# Patient Record
Sex: Male | Born: 1960 | Race: White | Hispanic: No | Marital: Married | Smoking: Never smoker
Health system: Southern US, Community
[De-identification: ages and names within clinical notes are randomized; demographics above are authoritative.]

## PROBLEM LIST (undated history)

## (undated) DIAGNOSIS — R861 Abnormal level of hormones in specimens from male genital organs: Secondary | ICD-10-CM

## (undated) DIAGNOSIS — I509 Heart failure, unspecified: Secondary | ICD-10-CM

## (undated) HISTORY — DX: Abnormal level of hormones in specimens from male genital organs: R86.1

## (undated) HISTORY — DX: Heart failure, unspecified: I50.9

---

## 2012-01-03 ENCOUNTER — Ambulatory Visit: Payer: Self-pay | Admitting: Internal Medicine

## 2013-12-16 IMAGING — CR RIGHT HAND - COMPLETE 3+ VIEW
1 series · 3 of 3 positions shown · non-contrast
Comparison: none

REASON FOR EXAM: arthraigia
COMMENTS:

PROCEDURE:     DXR - DXR HAND RT COMPLETE W/OBLIQUES  - January 03, 2012  [DATE]
RESULT:     Comparison:  None

[Series 1: x hand pa right · 0.14mm/px · 3 of 3 slices shown]
[im 1/3]
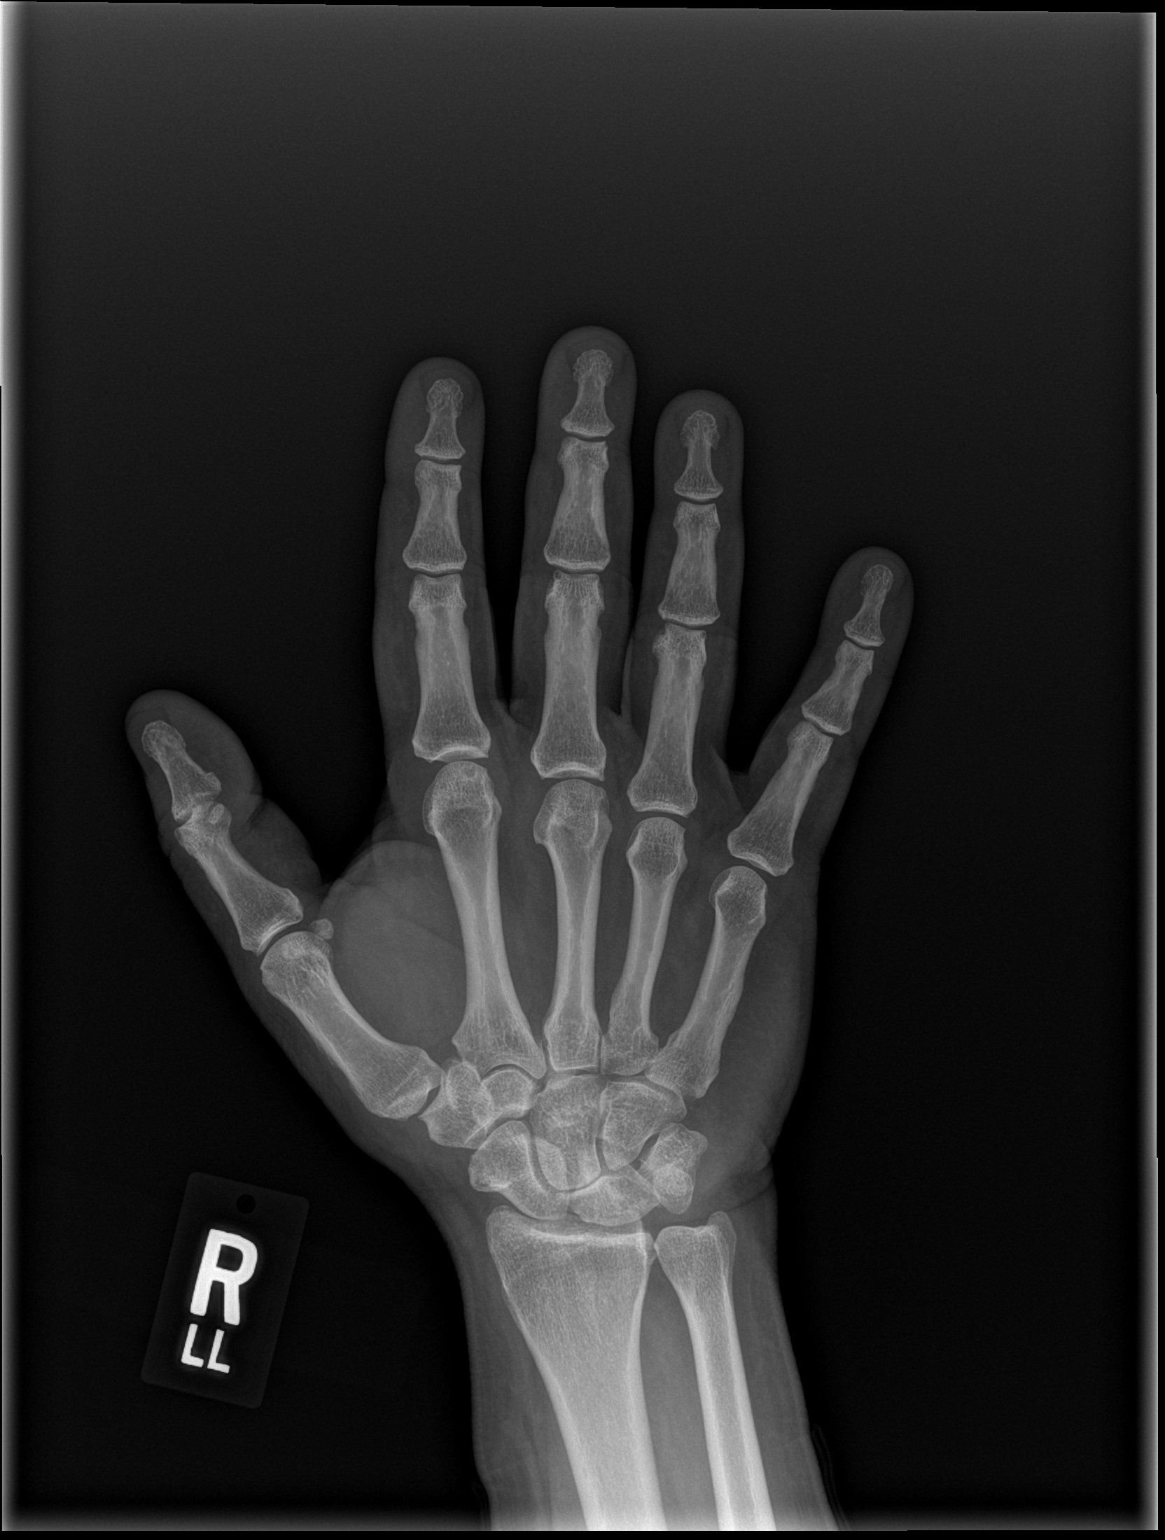
[im 2/3]
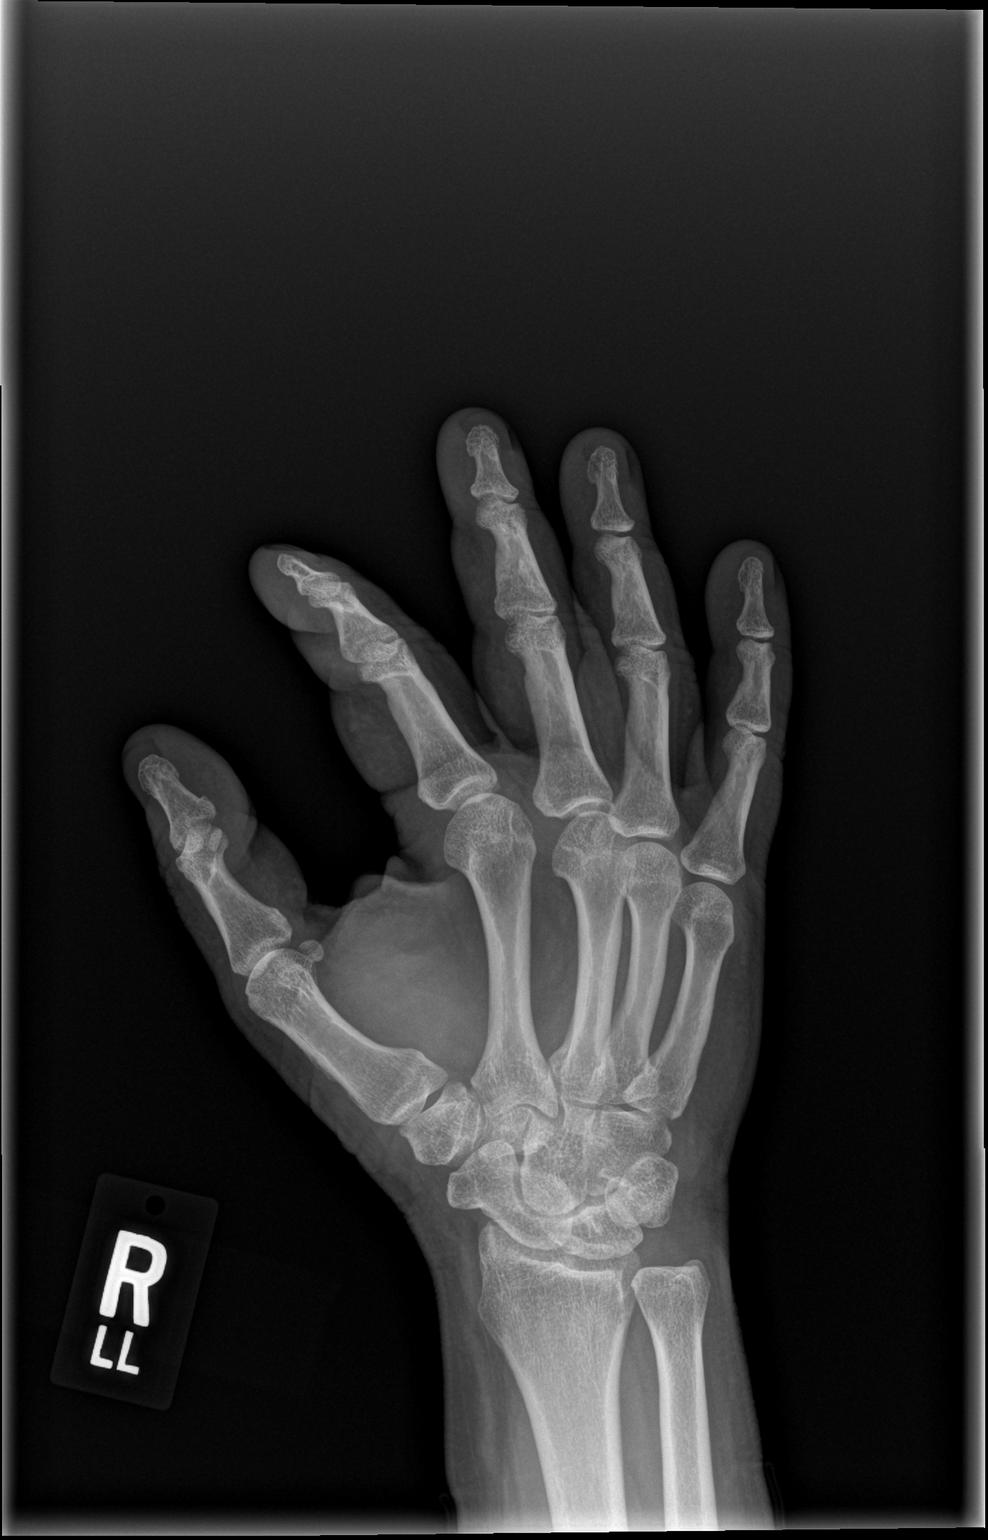
[im 3/3]
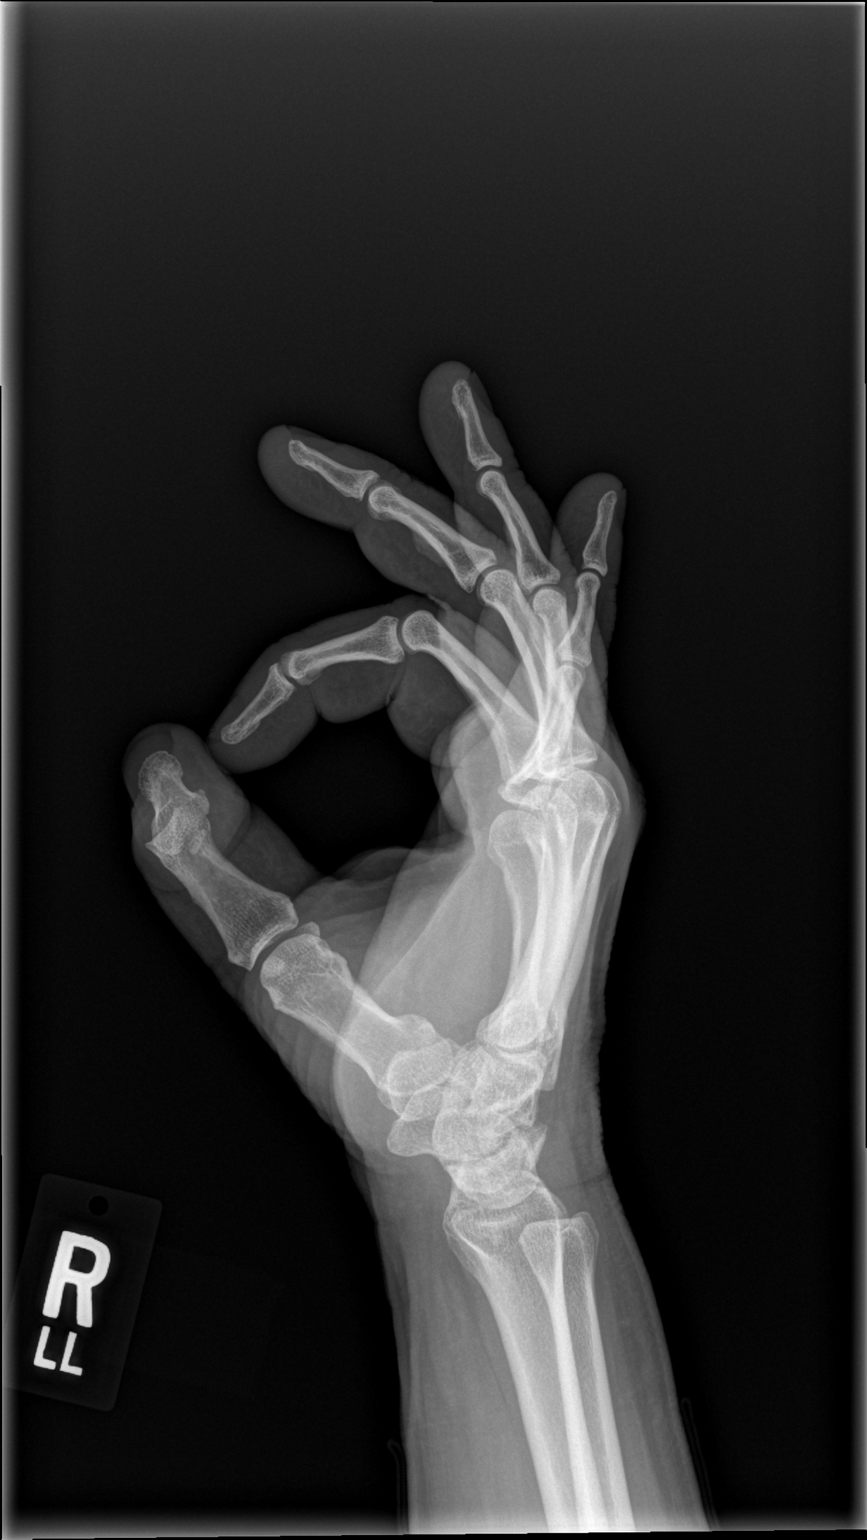

[3 of 3 positions shown; findings below may reference images not displayed]

FINDINGS: AP, oblique, and lateral views of the right hand demonstrates no fracture or
dislocation. There is normal bone mineralization. There are no erosive
changes. The joint spaces are maintained. There is no soft tissue swelling.
IMPRESSION: No acute osseous abnormality of the right hand.

[REDACTED]

## 2019-10-18 ENCOUNTER — Telehealth: Payer: Self-pay | Admitting: *Deleted

## 2019-10-18 NOTE — Telephone Encounter (Signed)
Error

## 2023-04-18 ENCOUNTER — Encounter: Payer: Self-pay | Admitting: Cardiology

## 2023-04-18 ENCOUNTER — Ambulatory Visit: Payer: Self-pay | Attending: Cardiology | Admitting: Cardiology

## 2023-04-18 VITALS — BP 110/78 | HR 106 | Resp 16 | Ht 67.0 in | Wt 213.0 lb

## 2023-04-18 DIAGNOSIS — R0601 Orthopnea: Secondary | ICD-10-CM | POA: Insufficient documentation

## 2023-04-18 DIAGNOSIS — R0609 Other forms of dyspnea: Secondary | ICD-10-CM | POA: Insufficient documentation

## 2023-04-18 DIAGNOSIS — R0600 Dyspnea, unspecified: Secondary | ICD-10-CM | POA: Insufficient documentation

## 2023-04-18 MED ORDER — FUROSEMIDE 20 MG PO TABS: 20.0000 mg | ORAL_TABLET | Freq: Every day | ORAL | 3 refills | Status: DC

## 2023-04-18 NOTE — Patient Instructions (Signed)
 Medication Instructions:  RESTART Furosemide 20 mg take one tablet by mouth daily   *If you need a refill on your cardiac medications before your next appointment, please call your pharmacy*  Lab Work: CBC BMP BNP TSH  If you have labs (blood work) drawn today and your tests are completely normal, you will receive your results only by: MyChart Message (if you have MyChart) OR A paper copy in the mail If you have any lab test that is abnormal or we need to change your treatment, we will call you to review the results.  Testing/Procedures: ECHO  Your physician has requested that you have an echocardiogram. Echocardiography is a painless test that uses sound waves to create images of your heart. It provides your doctor with information about the size and shape of your heart and how well your heart's chambers and valves are working. This procedure takes approximately one hour. There are no restrictions for this procedure. Please do NOT wear cologne, perfume, aftershave, or lotions (deodorant is allowed). Please arrive 15 minutes prior to your appointment time.  Please note: We ask at that you not bring children with you during ultrasound (echo/ vascular) testing. Due to room size and safety concerns, children are not allowed in the ultrasound rooms during exams. Our front office staff cannot provide observation of children in our lobby area while testing is being conducted. An adult accompanying a patient to their appointment will only be allowed in the ultrasound room at the discretion of the ultrasound technician under special circumstances. We apologize for any inconvenience.   Follow-Up: At [facility], you and your health needs are our priority.  As part of our continuing mission to provide you with exceptional heart care, our providers are all part of one team.  This team includes your primary Cardiologist (physician) and Advanced Practice Providers or APPs (Physician Assistants  and Nurse Practitioners) who all work together to provide you with the care you need, when you need it.  Your next appointment:   2-3 week(s)  Provider:   Elder Negus, MD     We recommend signing up for the patient portal called "MyChart".  Sign up information is provided on this After Visit Summary.  MyChart is used to connect with patients for Virtual Visits (Telemedicine).  Patients are able to view lab/test results, encounter notes, upcoming appointments, etc.  Non-urgent messages can be sent to your provider as well.   To learn more about what you can do with MyChart, go to [email or web address].   Other Instructions      1st Floor: - Lobby - Registration  - Pharmacy  - Lab - Cafe  2nd Floor: - PV Lab - Diagnostic Testing (echo, CT, nuclear med)  3rd Floor: - Vacant  4th Floor: - TCTS (cardiothoracic surgery) - AFib Clinic - Structural Heart Clinic - Vascular Surgery  - Vascular Ultrasound  5th Floor: - HeartCare Cardiology (general and EP) - Clinical Pharmacy for coumadin, hypertension, lipid, weight-loss medications, and med management appointments    Valet parking services will be available as well.

## 2023-04-18 NOTE — Progress Notes (Signed)
 Cardiology Office Note:  .   Date:  04/18/2023  ID:  Calvin Drake, DOB May 26, 1960, MRN [id number] PCP: No primary care provider on file.  [facility] Providers Cardiologist:  Truett Mainland, MD PCP: No primary care provider on file.  No chief complaint on file.    Calvin Drake is a 63 y.o. male with exertional dyspnea, orthopnea, PND   Discussed the use of AI scribe software for clinical note transcription with the patient, who gave verbal consent to proceed.  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 The patient reports that he has been feeling unwell since December, when he had a tooth filling fall out. He developed a blister on his gum, which he believes was an infection. After Christmas, he experienced a flu-like illness and was bedridden for three days. Following this, he noticed increasing shortness of breath, to the point where he could not walk a short distance without becoming breathless.  The patient was diagnosed with heart failure at an emergency room visit, with "fluid on his heart".  BNP was elevated at 2700.  He was treated with Lasix, which initially improved his breathing. However, he has since experienced fluctuating symptoms, with some days feeling well enough to work and others feeling too unwell to leave the house. The patient also reports a persistent cough and difficulty lying flat due to breathlessness.  In addition to his heart issues, the patient has had ongoing dental problems. He recently had another filling fall out and was found to have a pocket of infection in his gum. He was prescribed amoxicillin, which initially made him feel better but also caused gastrointestinal side effects. The patient believes that the combination of amoxicillin and Lasix led to dehydration.      Vitals:   04/18/23 1441  BP: 110/78  Pulse: (!) 106   Resp: 16  SpO2: 96%      Review of Systems  Cardiovascular:  Positive for dyspnea on exertion, orthopnea and paroxysmal nocturnal dyspnea. Negative for chest pain, leg swelling, palpitations and syncope.        Studies Reviewed: Marland Kitchen        EKG 04/18/2023: Sinus tachycardia 107 bpm Possible Anterior infarct , age undetermined No previous ECGs available     Independently interpreted BNP 2700   Physical Exam Vitals and nursing note reviewed.  Constitutional:      General: He is not in acute distress. Neck:     Vascular: JVD present.  Cardiovascular:     Rate and Rhythm: Normal rate and regular rhythm.     Heart sounds: Murmur heard.     High-pitched blowing holosystolic murmur is present with a grade of 3/6 at the apex.  Pulmonary:     Effort: Pulmonary effort is normal.     Breath sounds: Normal breath sounds. No wheezing or rales.  Musculoskeletal:     Right lower leg: Edema (Trace) present.     Left lower leg: Edema (Trace\) present.     VISIT DIAGNOSES:   ICD-10-CM   1. Exertional dyspnea  R06.09 EKG 12-Lead    ECHOCARDIOGRAM COMPLETE    CBC    Basic metabolic panel with GFR    Brain natriuretic peptide    TSH    2. Orthopnea  R06.01     3. PND (paroxysmal nocturnal dyspnea)  R06.00        Calvin Drake is a 63 y.o. male  with exertional dyspnea Assessment and Plan Assessment & Plan Exertional dyspnea: Along with orthopnea and PND symptoms, as well as elevated BNP noted in recent ER visit, I do think patient has systolic heart failure.  I reckon his dental problems are coincidental and not directly related to his cardiac issues.  Viral illness may or may not be the inciting cause-possibility of viral cardiomyopathy.  He has prominent holosystolic murmur.  Question is whether systolic heart failure, possibly viral cardiomyopathy, caused severe mitral regurgitation, or if mitral regurgitation is primary, thereby causing his symptoms. Continue Lasix 20  mg daily, increase to 40 mg daily as needed for shortness of breath and inadequate urine output. Will obtain CBC, BMP, BNP, TSH labs today. Will obtain echocardiogram in next week or 2. His further management will depend on echocardiogram findings.        Meds ordered this encounter  Medications  . furosemide (LASIX) 20 MG tablet    Sig: Take 1 tablet (20 mg total) by mouth daily. Additional dose for inadequate urine output    Dispense:  125 tablet    Refill:  3     F/u in 3-4 weeks  Signed, Elder Negus, MD

## 2023-04-19 ENCOUNTER — Other Ambulatory Visit: Payer: Self-pay | Admitting: Cardiology

## 2023-04-19 LAB — CBC
Hematocrit: 44.9 % (ref 37.5–51.0)
Hemoglobin: 15.5 g/dL (ref 13.0–17.7)
MCH: 32.4 pg (ref 26.6–33.0)
MCHC: 34.5 g/dL (ref 31.5–35.7)
MCV: 94 fL (ref 79–97)
Platelets: 178 10*3/uL (ref 150–450)
RBC: 4.79 x10E6/uL (ref 4.14–5.80)
RDW: 11.9 % (ref 11.6–15.4)
WBC: 9.2 10*3/uL (ref 3.4–10.8)

## 2023-04-19 LAB — BASIC METABOLIC PANEL WITH GFR
BUN/Creatinine Ratio: 17 (ref 10–24)
BUN: 19 mg/dL (ref 8–27)
CO2: 23 mmol/L (ref 20–29)
Calcium: 9.1 mg/dL (ref 8.6–10.2)
Chloride: 104 mmol/L (ref 96–106)
Creatinine, Ser: 1.15 mg/dL (ref 0.76–1.27)
Glucose: 98 mg/dL (ref 70–99)
Potassium: 4.9 mmol/L (ref 3.5–5.2)
Sodium: 142 mmol/L (ref 134–144)
eGFR: 72 mL/min/{1.73_m2} (ref >59)

## 2023-04-19 LAB — TSH: TSH: 3.39 u[IU]/mL (ref 0.450–4.500)

## 2023-04-19 LAB — BRAIN NATRIURETIC PEPTIDE: BNP: 566.1 pg/mL — ABNORMAL HIGH (ref 0.0–100.0)

## 2023-04-19 NOTE — Progress Notes (Signed)
 BNP is improved compared to before, but still elevated 566.  Continue Lasix 20 mg daily.  On days when he has more shortness of breath or leg swelling than usual, he can take 40 mg daily.  Will await echocardiogram before further recommendations.  Thanks MJP

## 2023-04-22 ENCOUNTER — Ambulatory Visit (HOSPITAL_COMMUNITY): Payer: Self-pay | Attending: Cardiology

## 2023-04-22 DIAGNOSIS — R0609 Other forms of dyspnea: Secondary | ICD-10-CM | POA: Insufficient documentation

## 2023-04-22 LAB — ECHOCARDIOGRAM COMPLETE
AR max vel: 0.84 cm2
AV Area VTI: 0.55 cm2
AV Area mean vel: 0.74 cm2
AV Mean grad: 19 mmHg
AV Peak grad: 34 mmHg
Ao pk vel: 2.92 m/s
Area-P 1/2: 4.05 cm2
MV M vel: 3.82 m/s
MV Peak grad: 58.4 mmHg
MV VTI: 1.01 cm2
S' Lateral: 3.2 cm

## 2023-04-25 ENCOUNTER — Telehealth: Payer: Self-pay | Admitting: Cardiology

## 2023-04-25 NOTE — Telephone Encounter (Signed)
 Pt is returning call to nurse for results

## 2023-04-25 NOTE — Telephone Encounter (Signed)
Please review result note.

## 2023-04-25 NOTE — Progress Notes (Signed)
 There is severe leakiness of mitral valve, likely the primary cause of your symptoms.  Heart function is relatively well-preserved.  Given your symptoms, I do think we will need to discuss further steps, including more imaging and heart catheterization, and referral to valve surgery.  Your current appointment with me is on 05/17/2023.  I am happy to see you sooner to discuss this further.  We can do this as a televisit to save your trip, if you would prefer.  Thanks MJP

## 2023-04-28 ENCOUNTER — Encounter: Payer: Self-pay | Admitting: Cardiology

## 2023-04-28 ENCOUNTER — Ambulatory Visit: Payer: Self-pay | Attending: Cardiology | Admitting: Cardiology

## 2023-04-28 VITALS — BP 110/82 | HR 105 | Resp 16 | Ht 67.0 in | Wt 217.6 lb

## 2023-04-28 DIAGNOSIS — Z01810 Encounter for preprocedural cardiovascular examination: Secondary | ICD-10-CM | POA: Insufficient documentation

## 2023-04-28 DIAGNOSIS — I340 Nonrheumatic mitral (valve) insufficiency: Secondary | ICD-10-CM | POA: Insufficient documentation

## 2023-04-28 MED ORDER — FUROSEMIDE 20 MG PO TABS: 20.0000 mg | ORAL_TABLET | Freq: Two times a day (BID) | ORAL | 3 refills | Status: DC

## 2023-04-28 NOTE — Progress Notes (Signed)
 Cardiology Office Note:  .   Date:  04/28/2023  ID:  Calvin Drake, DOB 08-29-1960, MRN [id number] PCP: No primary care provider on file.  [facility] Providers Cardiologist:  Truett Mainland, MD PCP: No primary care provider on file.  Chief Complaint  Patient presents with   Results   Follow-up     Calvin Drake is a 63 y.o. male with severe symptomatic mitral regurgitation   Patient continues to have exertional dyspnea and fatigue symptoms.  His current taking Lasix 20 mg daily.  Reviewed recent echocardiogram results with the patient at length, details    Vitals:   04/28/23 1004  BP: 110/82  Pulse: (!) 105  Resp: 16  SpO2: 91%       Review of Systems  Cardiovascular:  Positive for dyspnea on exertion, orthopnea and paroxysmal nocturnal dyspnea. Negative for chest pain, leg swelling, palpitations and syncope.        Studies Reviewed: Marland Kitchen        EKG 04/18/2023: Sinus tachycardia 107 bpm Possible Anterior infarct , age undetermined No previous ECGs available     Independently interpreted BNP 2700  Independently interpreted Echocardiogram 04/22/2023: LVEF 60 to 65%.  Diastolic dysfunction likely erroneous given severe MR. Flail portion of posterior leaflet. Severe mitral valve regurgitation. Normal RV systolic function. Severe biatrial enlargement. Moderate pulmonary hypertension, RVSP 55 mmHg.    Physical Exam Vitals and nursing note reviewed.  Constitutional:      General: He is not in acute distress. Neck:     Vascular: JVD present.  Cardiovascular:     Rate and Rhythm: Normal rate and regular rhythm.     Heart sounds: Murmur heard.     High-pitched blowing holosystolic murmur is present with a grade of 3/6 at the apex.  Pulmonary:     Effort: Pulmonary effort is normal.     Breath sounds: Normal breath sounds. No wheezing or rales.  Musculoskeletal:     Right lower leg: No edema.     Left lower leg: No edema.      VISIT  DIAGNOSES:   ICD-10-CM   1. Severe mitral regurgitation  I34.0 Ambulatory referral to Cardiothoracic Surgery    CANCELED: Basic Metabolic Panel (BMET)    CANCELED: CBC    2. Pre-procedural cardiovascular examination  Z01.810 CANCELED: Basic Metabolic Panel (BMET)    CANCELED: CBC        Calvin Drake is a 63 y.o. male with severe symptomatic mitral regurgitation  Assessment & Plan Mitral regurgitation: Thickening of mitral valve leaflets with flail posterior leaflet.  I suspect this likely became flail around Christmas when his symptoms started.  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 Given his symptoms, severe MR, and presence of pulmonary hypertension, mitral valve repair is absolutely indicated.   I will refer him to CVTS for consideration for surgical mitral valve repair. I will arrange TEE and right and left heart catheterization, scheduled on 05/16/2022. In the meantime, increase Lasix to 20 mg twice daily.  I have reviewed the risks, indications, and alternatives to cardiac catheterization, possible angioplasty, and stenting with the patient. Risks include but are not limited to bleeding, infection, vascular injury, stroke, myocardial infection, arrhythmia, kidney injury, radiation-related injury in the case of prolonged fluoroscopy use, emergency cardiac surgery, and death. The patient understands the risks of serious complication is 1-2 in 1000 with diagnostic cardiac cath and 1-2% or less with angioplasty/stenting.     [facility] has been requested to perform  a transesophageal echocardiogram on Calvin Drake for transesophageal echocardiography.     The patient does NOT have any absolute or relative contraindications to a Transesophageal Echocardiogram (TEE).  The patient has: History of Severe Valve Disease (stenosis or regurgitation)  After careful review of history and examination, the risks and benefits of transesophageal echocardiogram have been  explained including risks of esophageal damage, perforation (1:10,000 risk), bleeding, pharyngeal hematoma as well as other potential complications associated with conscious sedation including aspiration, arrhythmia, respiratory failure and death. Alternatives to treatment were discussed, questions were answered. Patient is willing to proceed.   Signed, Elder Negus, MD  04/28/2023 1:15 PM          Meds ordered this encounter  Medications   furosemide (LASIX) 20 MG tablet    Sig: Take 1 tablet (20 mg total) by mouth in the morning and at bedtime.    Dispense:  180 tablet    Refill:  3     F/u in 3 months  Signed, Elder Negus, MD

## 2023-04-28 NOTE — H&P (View-Only) (Signed)
 Cardiology Office Note:  .   Date:  04/28/2023  ID:  Calvin Drake, DOB 08-29-1960, MRN [id number] PCP: No primary care provider on file.  [facility] Providers Cardiologist:  Truett Mainland, MD PCP: No primary care provider on file.  Chief Complaint  Patient presents with   Results   Follow-up     Calvin Drake is a 63 y.o. male with severe symptomatic mitral regurgitation   Patient continues to have exertional dyspnea and fatigue symptoms.  His current taking Lasix 20 mg daily.  Reviewed recent echocardiogram results with the patient at length, details    Vitals:   04/28/23 1004  BP: 110/82  Pulse: (!) 105  Resp: 16  SpO2: 91%       Review of Systems  Cardiovascular:  Positive for dyspnea on exertion, orthopnea and paroxysmal nocturnal dyspnea. Negative for chest pain, leg swelling, palpitations and syncope.        Studies Reviewed: Marland Kitchen        EKG 04/18/2023: Sinus tachycardia 107 bpm Possible Anterior infarct , age undetermined No previous ECGs available     Independently interpreted BNP 2700  Independently interpreted Echocardiogram 04/22/2023: LVEF 60 to 65%.  Diastolic dysfunction likely erroneous given severe MR. Flail portion of posterior leaflet. Severe mitral valve regurgitation. Normal RV systolic function. Severe biatrial enlargement. Moderate pulmonary hypertension, RVSP 55 mmHg.    Physical Exam Vitals and nursing note reviewed.  Constitutional:      General: He is not in acute distress. Neck:     Vascular: JVD present.  Cardiovascular:     Rate and Rhythm: Normal rate and regular rhythm.     Heart sounds: Murmur heard.     High-pitched blowing holosystolic murmur is present with a grade of 3/6 at the apex.  Pulmonary:     Effort: Pulmonary effort is normal.     Breath sounds: Normal breath sounds. No wheezing or rales.  Musculoskeletal:     Right lower leg: No edema.     Left lower leg: No edema.      VISIT  DIAGNOSES:   ICD-10-CM   1. Severe mitral regurgitation  I34.0 Ambulatory referral to Cardiothoracic Surgery    CANCELED: Basic Metabolic Panel (BMET)    CANCELED: CBC    2. Pre-procedural cardiovascular examination  Z01.810 CANCELED: Basic Metabolic Panel (BMET)    CANCELED: CBC        Calvin Drake is a 63 y.o. male with severe symptomatic mitral regurgitation  Assessment & Plan Mitral regurgitation: Thickening of mitral valve leaflets with flail posterior leaflet.  I suspect this likely became flail around Christmas when his symptoms started.  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 Given his symptoms, severe MR, and presence of pulmonary hypertension, mitral valve repair is absolutely indicated.   I will refer him to CVTS for consideration for surgical mitral valve repair. I will arrange TEE and right and left heart catheterization, scheduled on 05/16/2022. In the meantime, increase Lasix to 20 mg twice daily.  I have reviewed the risks, indications, and alternatives to cardiac catheterization, possible angioplasty, and stenting with the patient. Risks include but are not limited to bleeding, infection, vascular injury, stroke, myocardial infection, arrhythmia, kidney injury, radiation-related injury in the case of prolonged fluoroscopy use, emergency cardiac surgery, and death. The patient understands the risks of serious complication is 1-2 in 1000 with diagnostic cardiac cath and 1-2% or less with angioplasty/stenting.     [facility] has been requested to perform  a transesophageal echocardiogram on Calvin Drake for transesophageal echocardiography.     The patient does NOT have any absolute or relative contraindications to a Transesophageal Echocardiogram (TEE).  The patient has: History of Severe Valve Disease (stenosis or regurgitation)  After careful review of history and examination, the risks and benefits of transesophageal echocardiogram have been  explained including risks of esophageal damage, perforation (1:10,000 risk), bleeding, pharyngeal hematoma as well as other potential complications associated with conscious sedation including aspiration, arrhythmia, respiratory failure and death. Alternatives to treatment were discussed, questions were answered. Patient is willing to proceed.   Signed, Elder Negus, MD  04/28/2023 1:15 PM          Meds ordered this encounter  Medications   furosemide (LASIX) 20 MG tablet    Sig: Take 1 tablet (20 mg total) by mouth in the morning and at bedtime.    Dispense:  180 tablet    Refill:  3     F/u in 3 months  Signed, Elder Negus, MD

## 2023-04-28 NOTE — Patient Instructions (Addendum)
 Medication Instructions:  INCREASE Lasix to twice daily   *If you need a refill on your cardiac medications before your next appointment, please call your pharmacy*  Lab Work:  If you have labs (blood work) drawn today and your tests are completely normal, you will receive your results only by: MyChart Message (if you have MyChart) OR A paper copy in the mail If you have any lab test that is abnormal or we need to change your treatment, we will call you to review the results.  Testing/Procedures:        Cardiac/Peripheral Catheterization   You are scheduled for a Cardiac Catheterization on Monday, April 28 with Dr.  Rosemary Holms .  1. Please arrive at the [facility] (Main Entrance A) at [facility]: [address] [place], [place] [address] at 5:30 AM (This time is 2 hour(s) before your procedure to ensure your preparation).   Free valet parking service is available. You will check in at ADMITTING. The support person will be asked to wait in the waiting room.  It is OK to have someone drop you off and come back when you are ready to be discharged.        Special note: Every effort is made to have your procedure done on time. Please understand that emergencies sometimes delay scheduled procedures.  2. Diet: Do not eat solid foods after midnight.  You may have clear liquids until 5 AM the day of the procedure.  3. Labs: Done   4. Medication instructions in preparation for your procedure:   Contrast Allergy: No  Stop taking, Lasix (Furosemide)  Monday, April 28,  On the morning of your procedure, take Aspirin 81 mg and any morning medicines NOT listed above.  You may use sips of water.  5. Plan to go home the same day, you will only stay overnight if medically necessary. 6. You MUST have a responsible adult to drive you home. 7. An adult MUST be with you the first 24 hours after you arrive home. 8. Bring a current list of your medications, and the last time and date  medication taken. 9. Bring ID and current insurance cards. 10.Please wear clothes that are easy to get on and off and wear slip-on shoes.  Thank you for allowing [place] to care for you!   -- [facility]   Follow-Up: At [facility], you and your health needs are our priority.  As part of our continuing mission to provide you with exceptional heart care, our providers are all part of one team.  This team includes your primary Cardiologist (physician) and Advanced Practice Providers or APPs (Physician Assistants and Nurse Practitioners) who all work together to provide you with the care you need, when you need it.  Your next appointment:  3 months with Dr Rosemary Holms  We recommend signing up for the patient portal called "MyChart".  Sign up information is provided on this After Visit Summary.  MyChart is used to connect with patients for Virtual Visits (Telemedicine).  Patients are able to view lab/test results, encounter notes, upcoming appointments, etc.  Non-urgent messages can be sent to your provider as well.   To learn more about what you can do with MyChart, go to [email or web address].   Other Instructions       1st Floor: - Lobby - Registration  - Pharmacy  - Lab - Cafe  2nd Floor: - PV Lab - Diagnostic Testing (echo, CT, nuclear med)  3rd  Floor: - Vacant  4th Floor: - TCTS (cardiothoracic surgery) - AFib Clinic - Structural Heart Clinic - Vascular Surgery  - Vascular Ultrasound  5th Floor: - HeartCare Cardiology (general and EP) - Clinical Pharmacy for coumadin, hypertension, lipid, weight-loss medications, and med management appointments    Valet parking services will be available as well.

## 2023-05-02 ENCOUNTER — Telehealth: Payer: Self-pay

## 2023-05-02 NOTE — Telephone Encounter (Signed)
 I have spoke to Dr. Colvin Dec at [facility] and obtained a surgical clearance and entered it in to the format per office protocol. It has been routed it pre-op call back.

## 2023-05-02 NOTE — Telephone Encounter (Signed)
 Wife is calling back to check on clearance because pt is in a lot of pain. Please give her a call to let her know if he is cleared.

## 2023-05-02 NOTE — Telephone Encounter (Signed)
   Pre-operative Risk Assessment    Patient Name: Calvin Drake  DOB: Jul 12, 1960 MRN: [id number]   Date of last office visit: 04/28/23 WITH DR. PATWARDHAN  Date of next office visit: 08/09/23 WITH DR. PATWARDHAN   Request for Surgical Clearance    Procedure:  Dental Extraction - Amount of Teeth to be Pulled:  1  Date of Surgery:  Clearance 05/02/23                                Surgeon:  DR. Colvin Dec Surgeon's Group or Practice Name:  [facility] Phone number:  [phone] Fax number:  [phone]   Type of Clearance Requested:   - Medical    Type of Anesthesia:   LOCAL - LIDOCAINE WITH EPI     Additional requests/questions:    SignedVira Grieves   05/02/2023, 10:17 AM

## 2023-05-02 NOTE — Telephone Encounter (Signed)
 Clearance request sent to preop.

## 2023-05-02 NOTE — Telephone Encounter (Signed)
    Primary Cardiologist: Cody Das, MD  Chart reviewed as part of pre-operative protocol coverage. Simple dental extractions are considered low risk procedures per guidelines and generally do not require any specific cardiac clearance. It is also generally accepted that for simple extractions and dental cleanings, there is no need to interrupt blood thinner therapy.   SBE prophylaxis is not required for the patient.  I will route this recommendation to the requesting party via Epic fax function and remove from pre-op pool.  Please call with questions.  Ava Boatman, NP 05/02/2023, 2:55 PM

## 2023-05-02 NOTE — Telephone Encounter (Signed)
 Spoke with pt. He is currently in the chair at the dentist and is in need of an extraction. Pt needs clearance prior to going through with the procedure. Message was forwarded to pre op call back team for assistance. Pt was told he would get a call back. Pt verbalized understanding. All questions if any were answered.

## 2023-05-04 NOTE — Telephone Encounter (Signed)
 I will re-fax clearance notes to the dental office.   I have been given an alternate fax # today of [phone].

## 2023-05-04 NOTE — Telephone Encounter (Signed)
 Notes re-faxed to the original fax# on clearance request.

## 2023-05-04 NOTE — Telephone Encounter (Signed)
 Pt just called back and said the alt fax was incorrect and to send it to the original fax  [phone] or email  [email or web address]

## 2023-05-04 NOTE — Telephone Encounter (Signed)
 Wife calling back and said dentist has not received it. Please try to fax it to [phone]

## 2023-05-12 ENCOUNTER — Telehealth: Payer: Self-pay | Admitting: *Deleted

## 2023-05-12 NOTE — Telephone Encounter (Signed)
 Cardiac Catheterization scheduled at [facility] for: Monday May 16, 2023 12 Noon/TEE 9:30 AM Arrival time [facility] Main Entrance A at: 8:30 AM  Nothing to eat or drink after midnight prior to procedures.   Medication instructions: -Hold:  Lasix -AM of procedure -Other usual morning medications can be taken with sips of water including aspirin 81 mg.  Plan to go home the same day, you will only stay overnight if medically necessary.  You must have responsible adult to drive you home.  Someone must be with you the first 24 hours after you arrive home.  Reviewed procedure instructions/changes in arrival/ procedure times from original schedule.

## 2023-05-13 NOTE — Progress Notes (Signed)
 Spoke to patient and instructed them to come at 0745  and to be NPO after 0000.  Medications reviewed.    Confirmed that patient will have a ride home and someone to stay with them for 24 hours after the procedure.

## 2023-05-16 ENCOUNTER — Encounter (HOSPITAL_COMMUNITY)
Admission: RE | Disposition: A | Discharge status: Home / Self Care | Payer: Self-pay | Source: Home / Self Care | Attending: Thoracic Surgery (Cardiothoracic Vascular Surgery)

## 2023-05-16 ENCOUNTER — Ambulatory Visit (HOSPITAL_COMMUNITY): Payer: Self-pay | Admitting: Anesthesiology

## 2023-05-16 ENCOUNTER — Inpatient Hospital Stay (HOSPITAL_COMMUNITY)
Admission: RE | Admit: 2023-05-16 | Discharge: 2023-06-02 | DRG: 216 | Disposition: A | Discharge status: Home / Self Care | Payer: Self-pay | Attending: Thoracic Surgery (Cardiothoracic Vascular Surgery) | Admitting: Thoracic Surgery (Cardiothoracic Vascular Surgery)

## 2023-05-16 ENCOUNTER — Ambulatory Visit (HOSPITAL_COMMUNITY)
Admission: RE | Admit: 2023-05-16 | Discharge: 2023-05-16 | Disposition: A | Discharge status: Home / Self Care | Payer: Self-pay | Source: Ambulatory Visit | Attending: Cardiovascular Disease | Admitting: Cardiovascular Disease

## 2023-05-16 ENCOUNTER — Encounter (HOSPITAL_COMMUNITY): Payer: Self-pay | Admitting: Cardiology

## 2023-05-16 ENCOUNTER — Other Ambulatory Visit: Payer: Self-pay

## 2023-05-16 DIAGNOSIS — I493 Ventricular premature depolarization: Secondary | ICD-10-CM | POA: Diagnosis present

## 2023-05-16 DIAGNOSIS — R7303 Prediabetes: Secondary | ICD-10-CM | POA: Diagnosis present

## 2023-05-16 DIAGNOSIS — I2510 Atherosclerotic heart disease of native coronary artery without angina pectoris: Secondary | ICD-10-CM

## 2023-05-16 DIAGNOSIS — J9811 Atelectasis: Secondary | ICD-10-CM | POA: Diagnosis not present

## 2023-05-16 DIAGNOSIS — N179 Acute kidney failure, unspecified: Secondary | ICD-10-CM | POA: Diagnosis not present

## 2023-05-16 DIAGNOSIS — I340 Nonrheumatic mitral (valve) insufficiency: Secondary | ICD-10-CM

## 2023-05-16 DIAGNOSIS — Z6834 Body mass index (BMI) 34.0-34.9, adult: Secondary | ICD-10-CM

## 2023-05-16 DIAGNOSIS — I272 Pulmonary hypertension, unspecified: Secondary | ICD-10-CM | POA: Diagnosis present

## 2023-05-16 DIAGNOSIS — D62 Acute posthemorrhagic anemia: Secondary | ICD-10-CM | POA: Diagnosis not present

## 2023-05-16 DIAGNOSIS — E871 Hypo-osmolality and hyponatremia: Secondary | ICD-10-CM | POA: Diagnosis present

## 2023-05-16 DIAGNOSIS — Z9889 Other specified postprocedural states: Secondary | ICD-10-CM

## 2023-05-16 DIAGNOSIS — D72829 Elevated white blood cell count, unspecified: Secondary | ICD-10-CM | POA: Diagnosis present

## 2023-05-16 DIAGNOSIS — E876 Hypokalemia: Secondary | ICD-10-CM | POA: Diagnosis present

## 2023-05-16 DIAGNOSIS — Q2112 Patent foramen ovale: Secondary | ICD-10-CM

## 2023-05-16 DIAGNOSIS — I509 Heart failure, unspecified: Secondary | ICD-10-CM

## 2023-05-16 DIAGNOSIS — Z7982 Long term (current) use of aspirin: Secondary | ICD-10-CM

## 2023-05-16 DIAGNOSIS — I472 Ventricular tachycardia, unspecified: Secondary | ICD-10-CM | POA: Diagnosis present

## 2023-05-16 DIAGNOSIS — D696 Thrombocytopenia, unspecified: Secondary | ICD-10-CM | POA: Diagnosis not present

## 2023-05-16 DIAGNOSIS — E669 Obesity, unspecified: Secondary | ICD-10-CM

## 2023-05-16 DIAGNOSIS — I5033 Acute on chronic diastolic (congestive) heart failure: Secondary | ICD-10-CM | POA: Diagnosis present

## 2023-05-16 DIAGNOSIS — I511 Rupture of chordae tendineae, not elsewhere classified: Secondary | ICD-10-CM | POA: Diagnosis present

## 2023-05-16 DIAGNOSIS — Z79899 Other long term (current) drug therapy: Secondary | ICD-10-CM

## 2023-05-16 HISTORY — PX: RIGHT/LEFT HEART CATH AND CORONARY ANGIOGRAPHY: CATH118266

## 2023-05-16 HISTORY — PX: TRANSESOPHAGEAL ECHOCARDIOGRAM (CATH LAB): EP1270

## 2023-05-16 LAB — POCT I-STAT EG7
Acid-Base Excess: 0 mmol/L (ref 0.0–2.0)
Acid-base deficit: 1 mmol/L (ref 0.0–2.0)
Bicarbonate: 24.3 mmol/L (ref 20.0–28.0)
Bicarbonate: 25.5 mmol/L (ref 20.0–28.0)
Calcium, Ion: 1.13 mmol/L — ABNORMAL LOW (ref 1.15–1.40)
Calcium, Ion: 1.19 mmol/L (ref 1.15–1.40)
HCT: 42 % (ref 39.0–52.0)
HCT: 44 % (ref 39.0–52.0)
Hemoglobin: 14.3 g/dL (ref 13.0–17.0)
Hemoglobin: 15 g/dL (ref 13.0–17.0)
O2 Saturation: 47 %
O2 Saturation: 53 %
Potassium: 3.8 mmol/L (ref 3.5–5.1)
Potassium: 4 mmol/L (ref 3.5–5.1)
Sodium: 136 mmol/L (ref 135–145)
Sodium: 140 mmol/L (ref 135–145)
TCO2: 26 mmol/L (ref 22–32)
TCO2: 27 mmol/L (ref 22–32)
pCO2, Ven: 42 mmHg — ABNORMAL LOW (ref 44–60)
pCO2, Ven: 42.2 mmHg — ABNORMAL LOW (ref 44–60)
pH, Ven: 7.368 (ref 7.25–7.43)
pH, Ven: 7.391 (ref 7.25–7.43)
pO2, Ven: 26 mmHg — CL (ref 32–45)
pO2, Ven: 28 mmHg — CL (ref 32–45)

## 2023-05-16 LAB — POCT I-STAT 7, (LYTES, BLD GAS, ICA,H+H)
Acid-base deficit: 1 mmol/L (ref 0.0–2.0)
Bicarbonate: 22.1 mmol/L (ref 20.0–28.0)
Calcium, Ion: 1.13 mmol/L — ABNORMAL LOW (ref 1.15–1.40)
HCT: 43 % (ref 39.0–52.0)
Hemoglobin: 14.6 g/dL (ref 13.0–17.0)
O2 Saturation: 93 %
Potassium: 3.9 mmol/L (ref 3.5–5.1)
Sodium: 141 mmol/L (ref 135–145)
TCO2: 23 mmol/L (ref 22–32)
pCO2 arterial: 33.2 mmHg (ref 32–48)
pH, Arterial: 7.431 (ref 7.35–7.45)
pO2, Arterial: 65 mmHg — ABNORMAL LOW (ref 83–108)

## 2023-05-16 LAB — ECHO TEE
MV M vel: 3.63 m/s
MV Peak grad: 52.6 mmHg
Radius: 1.6 cm

## 2023-05-16 SURGERY — TRANSESOPHAGEAL ECHOCARDIOGRAM (TEE) (CATHLAB)
Anesthesia: Monitor Anesthesia Care

## 2023-05-16 SURGERY — RIGHT/LEFT HEART CATH AND CORONARY ANGIOGRAPHY
Anesthesia: LOCAL

## 2023-05-16 MED ORDER — SODIUM CHLORIDE 0.9 % WEIGHT BASED INFUSION: 1.0000 mL/kg/h | INTRAVENOUS | Status: DC

## 2023-05-16 MED ORDER — FUROSEMIDE 10 MG/ML IJ SOLN
40.0000 mg | Freq: Two times a day (BID) | INTRAMUSCULAR | Status: DC
  Administered 2023-05-16 – 2023-05-17 (×2): 40 mg via INTRAVENOUS
  Filled 2023-05-16 (×2): qty 4

## 2023-05-16 MED ORDER — HEPARIN (PORCINE) IN NACL 2000-0.9 UNIT/L-% IV SOLN
INTRAVENOUS | Status: DC | PRN
  Administered 2023-05-16: 1000 mL

## 2023-05-16 MED ORDER — MELATONIN 3 MG PO TABS
3.0000 mg | ORAL_TABLET | Freq: Every day | ORAL | Status: DC
  Administered 2023-05-16 – 2023-05-22 (×7): 3 mg via ORAL
  Filled 2023-05-16 (×8): qty 1

## 2023-05-16 MED ORDER — MIDAZOLAM HCL 2 MG/2ML IJ SOLN
INTRAMUSCULAR | Status: AC
  Filled 2023-05-16: qty 2

## 2023-05-16 MED ORDER — LIDOCAINE HCL (PF) 1 % IJ SOLN
INTRAMUSCULAR | Status: DC | PRN
  Administered 2023-05-16: 2 mL
  Administered 2023-05-16: 5 mL

## 2023-05-16 MED ORDER — FENTANYL CITRATE (PF) 100 MCG/2ML IJ SOLN
INTRAMUSCULAR | Status: DC | PRN
  Administered 2023-05-16: 25 ug via INTRAVENOUS

## 2023-05-16 MED ORDER — HEPARIN SODIUM (PORCINE) 1000 UNIT/ML IJ SOLN
INTRAMUSCULAR | Status: DC | PRN
  Administered 2023-05-16: 5000 [IU] via INTRAVENOUS

## 2023-05-16 MED ORDER — SODIUM CHLORIDE 0.9 % WEIGHT BASED INFUSION
3.0000 mL/kg/h | INTRAVENOUS | Status: AC
Start: 2023-05-16 — End: 2023-05-16
  Administered 2023-05-16: 3 mL/kg/h via INTRAVENOUS

## 2023-05-16 MED ORDER — PROPOFOL 10 MG/ML IV BOLUS
INTRAVENOUS | Status: DC | PRN
  Administered 2023-05-16: 60 mg via INTRAVENOUS
  Administered 2023-05-16 (×3): 10 mg via INTRAVENOUS
  Administered 2023-05-16: 20 mg via INTRAVENOUS
  Administered 2023-05-16 (×5): 10 mg via INTRAVENOUS

## 2023-05-16 MED ORDER — HEPARIN SODIUM (PORCINE) 1000 UNIT/ML IJ SOLN
INTRAMUSCULAR | Status: AC
  Filled 2023-05-16: qty 10

## 2023-05-16 MED ORDER — FENTANYL CITRATE (PF) 100 MCG/2ML IJ SOLN
INTRAMUSCULAR | Status: AC
Start: 2023-05-16 — End: ?
  Filled 2023-05-16: qty 2

## 2023-05-16 MED ORDER — IOHEXOL 350 MG/ML SOLN
INTRAVENOUS | Status: DC | PRN
  Administered 2023-05-16: 40 mL

## 2023-05-16 MED ORDER — SODIUM CHLORIDE 0.9 % IV SOLN: INTRAVENOUS | Status: DC

## 2023-05-16 MED ORDER — SODIUM CHLORIDE 0.9 % IV SOLN: 250.0000 mL | INTRAVENOUS | Status: AC | PRN

## 2023-05-16 MED ORDER — ONDANSETRON HCL 4 MG/2ML IJ SOLN: 4.0000 mg | Freq: Four times a day (QID) | INTRAMUSCULAR | Status: DC | PRN

## 2023-05-16 MED ORDER — MIDAZOLAM HCL 2 MG/2ML IJ SOLN
INTRAMUSCULAR | Status: DC | PRN
  Administered 2023-05-16: 1 mg via INTRAVENOUS

## 2023-05-16 MED ORDER — LABETALOL HCL 5 MG/ML IV SOLN: 10.0000 mg | INTRAVENOUS | Status: AC | PRN

## 2023-05-16 MED ORDER — VERAPAMIL HCL 2.5 MG/ML IV SOLN
INTRAVENOUS | Status: DC | PRN
Start: 2023-05-16 — End: 2023-05-16
  Administered 2023-05-16: 10 mL via INTRA_ARTERIAL

## 2023-05-16 MED ORDER — ACETAMINOPHEN 325 MG PO TABS: 650.0000 mg | ORAL_TABLET | ORAL | Status: DC | PRN

## 2023-05-16 MED ORDER — SODIUM CHLORIDE 0.9% FLUSH: 3.0000 mL | INTRAVENOUS | Status: DC | PRN

## 2023-05-16 MED ORDER — SODIUM CHLORIDE 0.9% FLUSH
3.0000 mL | Freq: Two times a day (BID) | INTRAVENOUS | Status: DC
  Administered 2023-05-16 – 2023-05-22 (×11): 3 mL via INTRAVENOUS

## 2023-05-16 MED ORDER — LIDOCAINE HCL (PF) 1 % IJ SOLN
INTRAMUSCULAR | Status: AC
  Filled 2023-05-16: qty 30

## 2023-05-16 MED ORDER — HYDRALAZINE HCL 20 MG/ML IJ SOLN: 10.0000 mg | INTRAMUSCULAR | Status: AC | PRN

## 2023-05-16 MED ORDER — LIDOCAINE 2% (20 MG/ML) 5 ML SYRINGE
INTRAMUSCULAR | Status: DC | PRN
Start: 2023-05-16 — End: 2023-05-16
  Administered 2023-05-16: 50 mg via INTRAVENOUS

## 2023-05-16 MED ORDER — ASPIRIN 81 MG PO CHEW
81.0000 mg | CHEWABLE_TABLET | ORAL | Status: DC
  Administered 2023-05-16: 81 mg via ORAL

## 2023-05-16 MED ORDER — VERAPAMIL HCL 2.5 MG/ML IV SOLN
INTRAVENOUS | Status: AC
  Filled 2023-05-16: qty 2

## 2023-05-16 SURGICAL SUPPLY — 10 items
CATH INFINITI AMBI 5FR TG (CATHETERS) IMPLANT
CATH SWAN GANZ 7F STRAIGHT (CATHETERS) IMPLANT
DEVICE RAD COMP TR BAND LRG (VASCULAR PRODUCTS) IMPLANT
GLIDESHEATH SLEND A-KIT 6F 22G (SHEATH) IMPLANT
GLIDESHEATH SLENDER 7FR .021G (SHEATH) IMPLANT
GUIDEWIRE INQWIRE 1.5J.035X260 (WIRE) IMPLANT
KIT SYRINGE INJ CVI SPIKEX1 (MISCELLANEOUS) IMPLANT
PACK CARDIAC CATHETERIZATION (CUSTOM PROCEDURE TRAY) ×1 IMPLANT
SET ATX-X65L (MISCELLANEOUS) IMPLANT
WIRE EMERALD 3MM-J .025X260CM (WIRE) IMPLANT

## 2023-05-16 NOTE — Progress Notes (Signed)
 TR band removed at 1703 with no complications

## 2023-05-16 NOTE — Interval H&P Note (Signed)
 History and Physical Interval Note:  05/16/2023 12:06 PM  Calvin Drake  has presented today for surgery, with the diagnosis of mr.  The various methods of treatment have been discussed with the patient and family. After consideration of risks, benefits and other options for treatment, the patient has consented to  Procedure(s): RIGHT/LEFT HEART CATH AND CORONARY ANGIOGRAPHY (N/A) as a surgical intervention.  The patient's history has been reviewed, patient examined, no change in status, stable for surgery.  I have reviewed the patient's chart and labs.  Questions were answered to the patient's satisfaction.     Natavia Sublette J Jary Louvier

## 2023-05-16 NOTE — Anesthesia Postprocedure Evaluation (Signed)
 Anesthesia Post Note  Patient: Calvin Drake  Procedure(s) Performed: TRANSESOPHAGEAL ECHOCARDIOGRAM     Patient location during evaluation: PACU Anesthesia Type: MAC Level of consciousness: awake and alert and oriented Pain management: pain level controlled Vital Signs Assessment: post-procedure vital signs reviewed and stable Respiratory status: spontaneous breathing, nonlabored ventilation and respiratory function stable Cardiovascular status: stable and blood pressure returned to baseline Postop Assessment: no apparent nausea or vomiting Anesthetic complications: no   No notable events documented.  Last Vitals:  Vitals:   05/16/23 0909 05/16/23 0910  BP: 99/67 98/78  Pulse: 90 97  Resp: (!) 22 (!) 26  Temp:    SpO2: 91% 92%    Last Pain:  Vitals:   05/16/23 0729  TempSrc: Temporal                 Kura Bethards A.

## 2023-05-16 NOTE — Interval H&P Note (Signed)
 History and Physical Interval Note:  05/16/2023 7:49 AM  Calvin Drake  has presented today for surgery, with the diagnosis of MR.  The various methods of treatment have been discussed with the patient and family. After consideration of risks, benefits and other options for treatment, the patient has consented to  Procedure(s): TRANSESOPHAGEAL ECHOCARDIOGRAM (N/A) as a surgical intervention.  The patient's history has been reviewed, patient examined, no change in status, stable for surgery.  I have reviewed the patient's chart and labs.  Questions were answered to the patient's satisfaction.     Kaylob Wallen

## 2023-05-16 NOTE — Plan of Care (Signed)
   Problem: Education: Goal: Knowledge of General Education information will improve Description: Including pain rating scale, medication(s)/side effects and non-pharmacologic comfort measures Outcome: Progressing   Problem: Clinical Measurements: Goal: Will remain free from infection Outcome: Progressing   Problem: Activity: Goal: Risk for activity intolerance will decrease Outcome: Progressing

## 2023-05-16 NOTE — Op Note (Signed)
 INDICATIONS: Mitral insufficiency  PROCEDURE:   Informed consent was obtained prior to the procedure. The risks, benefits and alternatives for the procedure were discussed and the patient comprehended these risks.  Risks include, but are not limited to, cough, sore throat, vomiting, nausea, somnolence, esophageal and stomach trauma or perforation, bleeding, low blood pressure, aspiration, pneumonia, infection, trauma to the teeth and death.    After a procedural time-out, the oropharynx was anesthetized with 20% benzocaine spray.   During this procedure the patient was administered IV propofol by Anesthesiology, Dr. Yvonnie Heritage.  The transesophageal probe was inserted in the esophagus and stomach without difficulty and multiple views were obtained.  The patient was kept under observation until the patient left the procedure room.  The patient left the procedure room in stable condition.   Agitated microbubble saline contrast was not administered.  COMPLICATIONS:    There were no immediate complications.  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 Mildly reduced LV function, EF 50%. RV is dilated and severely depressed. There are signs of reduced cardiac output.  RECOMMENDATIONS:     Continue evaluation with R and L heart cath.  Time Spent Directly with the Patient:  45 minutes   Ann-Marie Kluge 05/16/2023, 8:59 AM

## 2023-05-16 NOTE — Anesthesia Preprocedure Evaluation (Addendum)
 Anesthesia Evaluation  Patient identified by MRN, date of birth, ID band Patient awake    Reviewed: Allergy & Precautions, NPO status , Patient's Chart, lab work & pertinent test results  Airway Mallampati: II  TM Distance: >3 FB     Dental  (+) Teeth Intact, Dental Advisory Given, Caps, Missing,    Pulmonary shortness of breath, with exertion, at rest and lying   Pulmonary exam normal breath sounds clear to auscultation       Cardiovascular +CHF, + Orthopnea and + PND  Normal cardiovascular exam(-) dysrhythmias  Rhythm:Regular Rate:Normal  Severe MR with flail posterior leaflet  EKG 04/18/23 ST 107/min, possible anterior MI  Echo 04/22/23 1. Flail portion of posterior leaflet. The mitral valve is normal in  structure. Severe mitral valve regurgitation. No evidence of mitral  stenosis. There is moderate holosystolic prolapse of the middle scallop of  the posterior leaflet of the mitral  valve.   2. Left ventricular ejection fraction, by estimation, is 60 to 65%. The  left ventricle has normal function. The left ventricle has no regional  wall motion abnormalities. Left ventricular diastolic parameters are  consistent with Grade II diastolic  dysfunction (pseudonormalization). There is the interventricular septum is  flattened in systole and diastole, consistent with right ventricular  pressure and volume overload.   3. Right ventricular systolic function is normal. The right ventricular  size is moderately enlarged. There is moderately elevated pulmonary artery  systolic pressure. The estimated right ventricular systolic pressure is  55.7 mmHg.   4. Left atrial size was severely dilated.   5. Right atrial size was severely dilated.   6. The aortic valve is tricuspid. Aortic valve regurgitation is not  visualized. No aortic stenosis is present.   7. The inferior vena cava is normal in size with greater than 50%  respiratory  variability, suggesting right atrial pressure of 3 mmHg.      Neuro/Psych negative neurological ROS  negative psych ROS   GI/Hepatic negative GI ROS, Neg liver ROS,,,  Endo/Other  negative endocrine ROS    Renal/GU negative Renal ROS  negative genitourinary   Musculoskeletal negative musculoskeletal ROS (+)    Abdominal  (+) + obese  Peds  Hematology negative hematology ROS (+)   Anesthesia Other Findings   Reproductive/Obstetrics                              Anesthesia Physical Anesthesia Plan  ASA: 3  Anesthesia Plan: MAC   Post-op Pain Management: Minimal or no pain anticipated   Induction: Intravenous  PONV Risk Score and Plan: 1 and Treatment may vary due to age or medical condition and Propofol infusion  Airway Management Planned: Natural Airway and Nasal Cannula  Additional Equipment: None  Intra-op Plan:   Post-operative Plan:   Informed Consent: I have reviewed the patients History and Physical, chart, labs and discussed the procedure including the risks, benefits and alternatives for the proposed anesthesia with the patient or authorized representative who has indicated his/her understanding and acceptance.     Dental advisory given  Plan Discussed with: CRNA and Anesthesiologist  Anesthesia Plan Comments:          Anesthesia Quick Evaluation

## 2023-05-16 NOTE — Progress Notes (Signed)
  Echocardiogram Echocardiogram Transesophageal has been performed.  Calvin Drake 05/16/2023, 9:27 AM

## 2023-05-16 NOTE — H&P (Signed)
 OV 04/28/2023 copied for documentation   Cardiology Office Note:  .   Date:  05/16/2023  ID:  Calvin Drake, DOB October 07, 1960, MRN [id number] PCP: [facility]  [facility] Providers Cardiologist:  Fransico Ivy, MD PCP: [facility]  No chief complaint on file.    Calvin Drake is a 63 y.o. male with severe symptomatic mitral regurgitation   Patient continues to have exertional dyspnea and fatigue symptoms.  His current taking Lasix  20 mg daily.  Reviewed recent echocardiogram results with the patient at length, details    Vitals:   05/16/23 0729  BP: 124/88  Pulse: (!) 104  Resp: (!) 27  Temp: 98 F (36.7 C)  SpO2: 94%       Review of Systems  Cardiovascular:  Positive for dyspnea on exertion, orthopnea and paroxysmal nocturnal dyspnea. Negative for chest pain, leg swelling, palpitations and syncope.        Studies Reviewed: Aaron Aas        EKG 04/18/2023: Sinus tachycardia 107 bpm Possible Anterior infarct , age undetermined No previous ECGs available     Independently interpreted BNP 2700  Independently interpreted Echocardiogram 04/22/2023: LVEF 60 to 65%.  Diastolic dysfunction likely erroneous given severe MR. Flail portion of posterior leaflet. Severe mitral valve regurgitation. Normal RV systolic function. Severe biatrial enlargement. Moderate pulmonary hypertension, RVSP 55 mmHg.    Physical Exam Vitals and nursing note reviewed.  Constitutional:      General: He is not in acute distress. Neck:     Vascular: JVD present.  Cardiovascular:     Rate and Rhythm: Normal rate and regular rhythm.     Heart sounds: Murmur heard.     High-pitched blowing holosystolic murmur is present with a grade of 3/6 at the apex.  Pulmonary:     Effort: Pulmonary effort is normal.     Breath sounds: Normal breath sounds. No wheezing or rales.  Musculoskeletal:     Right lower leg: No edema.     Left lower leg:  No edema.      VISIT DIAGNOSES:   ICD-10-CM   1. Severe mitral regurgitation  I34.0 ECHO TEE    ECHO TEE        Calvin Drake is a 63 y.o. male with severe symptomatic mitral regurgitation  Assessment & Plan Mitral regurgitation: Thickening of mitral valve leaflets with flail posterior leaflet.  I suspect this likely became flail around Christmas when his symptoms started.  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 Given his symptoms, severe MR, and presence of pulmonary hypertension, mitral valve repair is absolutely indicated.   I will refer him to CVTS for consideration for surgical mitral valve repair. I will arrange TEE and right and left heart catheterization, scheduled on 05/16/2022. In the meantime, increase Lasix  to 20 mg twice daily.  I have reviewed the risks, indications, and alternatives to cardiac catheterization, possible angioplasty, and stenting with the patient. Risks include but are not limited to bleeding, infection, vascular injury, stroke, myocardial infection, arrhythmia, kidney injury, radiation-related injury in the case of prolonged fluoroscopy use, emergency cardiac surgery, and death. The patient understands the risks of serious complication is 1-2 in 1000 with diagnostic cardiac cath and 1-2% or less with angioplasty/stenting.     [facility] has been requested to perform a transesophageal echocardiogram on Calvin Drake for transesophageal echocardiography.     The patient does NOT have any absolute or relative contraindications to a Transesophageal Echocardiogram (TEE).  The patient has: History of Severe Valve Disease (stenosis or regurgitation)  After careful review of history and examination, the risks and benefits of transesophageal echocardiogram have been explained including risks of esophageal damage, perforation (1:10,000 risk), bleeding, pharyngeal hematoma as well as other potential complications associated with conscious sedation  including aspiration, arrhythmia, respiratory failure and death. Alternatives to treatment were discussed, questions were answered. Patient is willing to proceed.   Signed, Cody Das, MD  05/16/2023 9:01 AM          Meds ordered this encounter  Medications   0.9 %  sodium chloride infusion     F/u in 3 months  Signed, Cody Das, MD

## 2023-05-16 NOTE — Interval H&P Note (Signed)
 History and Physical Interval Note:  05/16/2023 9:02 AM  Calvin Drake  has presented today for surgery, with the diagnosis of mr.  The various methods of treatment have been discussed with the patient and family. After consideration of risks, benefits and other options for treatment, the patient has consented to  Procedure(s): RIGHT/LEFT HEART CATH AND CORONARY ANGIOGRAPHY (N/A) as a surgical intervention.  The patient's history has been reviewed, patient examined, no change in status, stable for surgery.  I have reviewed the patient's chart and labs.  Questions were answered to the patient's satisfaction.     Fatimata Talsma J Terresa Marlett

## 2023-05-16 NOTE — Transfer of Care (Signed)
 Immediate Anesthesia Transfer of Care Note  Patient: Calvin Drake  Procedure(s) Performed: TRANSESOPHAGEAL ECHOCARDIOGRAM  Patient Location: PACU  Anesthesia Type:MAC  Level of Consciousness: drowsy  Airway & Oxygen Therapy: Patient Spontanous Breathing and Patient connected to nasal cannula oxygen  Post-op Assessment: Report given to RN and Post -op Vital signs reviewed and stable  Post vital signs: Reviewed and stable  Last Vitals:  Vitals Value Taken Time  BP 99/76   Temp    Pulse 99   Resp 22   SpO2 92%     Last Pain:  Vitals:   05/16/23 0729  TempSrc: Temporal         Complications: No notable events documented.

## 2023-05-17 ENCOUNTER — Ambulatory Visit: Payer: Self-pay | Admitting: Cardiology

## 2023-05-17 ENCOUNTER — Inpatient Hospital Stay (HOSPITAL_COMMUNITY): Payer: Self-pay

## 2023-05-17 ENCOUNTER — Encounter (HOSPITAL_COMMUNITY): Payer: Self-pay | Admitting: Cardiovascular Disease

## 2023-05-17 DIAGNOSIS — I2510 Atherosclerotic heart disease of native coronary artery without angina pectoris: Secondary | ICD-10-CM

## 2023-05-17 DIAGNOSIS — I340 Nonrheumatic mitral (valve) insufficiency: Secondary | ICD-10-CM

## 2023-05-17 DIAGNOSIS — I509 Heart failure, unspecified: Secondary | ICD-10-CM

## 2023-05-17 DIAGNOSIS — Z01810 Encounter for preprocedural cardiovascular examination: Secondary | ICD-10-CM

## 2023-05-17 MED ORDER — ASPIRIN 81 MG PO TBEC
81.0000 mg | DELAYED_RELEASE_TABLET | Freq: Every day | ORAL | Status: DC
  Administered 2023-05-17 – 2023-05-22 (×5): 81 mg via ORAL
  Filled 2023-05-17 (×5): qty 1

## 2023-05-17 MED ORDER — FUROSEMIDE 10 MG/ML IJ SOLN
80.0000 mg | Freq: Two times a day (BID) | INTRAMUSCULAR | Status: DC
  Administered 2023-05-17 – 2023-05-18 (×3): 80 mg via INTRAVENOUS
  Filled 2023-05-17 (×3): qty 8

## 2023-05-17 MED ORDER — ROSUVASTATIN CALCIUM 20 MG PO TABS
40.0000 mg | ORAL_TABLET | Freq: Every day | ORAL | Status: DC
  Administered 2023-05-17 – 2023-06-02 (×16): 40 mg via ORAL
  Filled 2023-05-17 (×16): qty 2

## 2023-05-17 MED ORDER — HYDRALAZINE HCL 10 MG PO TABS
10.0000 mg | ORAL_TABLET | Freq: Three times a day (TID) | ORAL | Status: DC
  Administered 2023-05-17 – 2023-05-18 (×3): 10 mg via ORAL
  Filled 2023-05-17 (×4): qty 1

## 2023-05-17 MED ORDER — METOLAZONE 2.5 MG PO TABS
2.5000 mg | ORAL_TABLET | Freq: Every day | ORAL | Status: DC
  Administered 2023-05-17 – 2023-05-18 (×2): 2.5 mg via ORAL
  Filled 2023-05-17 (×2): qty 1

## 2023-05-17 MED ORDER — MILRINONE LACTATE IN DEXTROSE 20-5 MG/100ML-% IV SOLN
0.2500 ug/kg/min | INTRAVENOUS | Status: DC
  Administered 2023-05-17 – 2023-05-23 (×11): 0.25 ug/kg/min via INTRAVENOUS
  Filled 2023-05-17 (×10): qty 100

## 2023-05-17 NOTE — Progress Notes (Signed)
   05/17/23 1929  Assess: MEWS Score  Temp 98.2 F (36.8 C)  BP 110/68  MAP (mmHg) 81  Pulse Rate (!) 104  ECG Heart Rate (!) 104  Resp (!) 28  SpO2 94 %  O2 Device Room Air  Assess: MEWS Score  MEWS Temp 0  MEWS Systolic 0  MEWS Pulse 1  MEWS RR 2  MEWS LOC 0  MEWS Score 3  MEWS Score Color Yellow  Assess: if the MEWS score is Yellow or Red  Were vital signs accurate and taken at a resting state? Yes  Does the patient meet 2 or more of the SIRS criteria? No  MEWS guidelines implemented  Yes, yellow  Treat  MEWS Interventions Considered administering scheduled or prn medications/treatments as ordered  Take Vital Signs  Increase Vital Sign Frequency  Yellow: Q2hr x1, continue Q4hrs until patient remains green for 12hrs  Escalate  MEWS: Escalate Yellow: Discuss with charge nurse and consider notifying provider and/or RRT  Notify: Charge Nurse/RN  Name of Charge Nurse/RN Notified Tanya, RN  Assess: SIRS CRITERIA  SIRS Temperature  0  SIRS Respirations  1  SIRS Pulse 1  SIRS WBC 0  SIRS Score Sum  2

## 2023-05-17 NOTE — Care Management (Signed)
 MATCH MEDICATION ASSISTANCE CARD Pharmacies please call: [phone] for claim processing assistance.  Rx BIN: [id number] Rx Group: [id number] Rx PCN: [id number] Relationship Code: 1 Person Code: [id number]  Patient ID (MRN): [name] [id number]   Patient Name: Calvin Drake     Patient DOB: 1960/04/02    Discharge Date: 05/17/23    Expiration Date:05/25/2023 (must be filled within 7 days of discharge)

## 2023-05-17 NOTE — Consult Note (Signed)
 Advanced Heart Failure Team Consult Note   Primary Physician: [facility] Cardiologist:  Cody Das, MD  Reason for Consultation: Heart Failure, RV Failure and  Calvin  HPI:    Calvin Drake is seen today for evaluation of heart failure RV Failure/mitral regurgitation at the request of Dr Alvis Ba.   Calvin Drake is a 63 year old with a history of severe Calvin. No previous surgeries.   Started feeling bad January of this year after he had URI illness that lasted for about 3 weeks. Prior to this he was very active and has been working full time as Proofreader.  He was seen at Urgent Care and sent to hospital for acute heart failure. He has had ongoing dyspnea with exeriton and orthopnea.    He was referred to Cardiology for exertional dyspnea. Echo was set up.  LVEF 50-55% RV reduced and flail leaflets/severe Calvin. Tachycardic Rate 107 bpm.  At that time he was complaining of dyspnea with exertion, orthopnea, and PND.  He was set up for TEE and cath.   Admitted after TEE/ Cath. CT surgery consulted with recommendations for MVR. He has been diuresing with IV lasix     RHC/LHC 04/18/23 Non Obstructive CAD RA: 25 mmHg RV: 78/17 mmHg PA: 73/35 mmHg, mPAP 51 mmHg PCW: 38 mmHg  AO sats: 93% PA sats: 50%  CO: 3.0 L/min CI: 1.47 L/min/m2    Home Medications Prior to Admission medications   Medication Sig Start Date End Date Taking? Authorizing Provider  amoxicillin (AMOXIL) 500 MG tablet Take 500 mg by mouth every 8 (eight) hours. 04/08/23  Yes [provider]  aspirin EC 325 MG tablet Take 325 mg by mouth daily as needed (feeling bad).   Yes [provider]  furosemide  (LASIX ) 20 MG tablet Take 1 tablet (20 mg total) by mouth in the morning and at bedtime. 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 No pertinent family history.  Social History: Social History   Socioeconomic History   Marital status: Married    Spouse name: Not on file   Number of children: Not on file   Years of education: Not on file   Highest education level: Not on file  Occupational History   Not on file  Tobacco Use   Smoking status: Never   Smokeless tobacco: Never  Substance and Sexual Activity   Alcohol use: Not on file   Drug use: Not on file   Sexual activity: Not on file  Other Topics Concern   Not on file  Social History Narrative   Not on file   Social Drivers of Health   Financial Resource Strain: Not on file  Food Insecurity: No Food Insecurity (05/17/2023)   Hunger Vital Sign    Worried About Running  Out of Food in the Last Year: Never true    Ran Out of Food in the Last Year: Never true  Transportation Needs: No Transportation Needs (05/17/2023)   PRAPARE - Administrator, Civil Service (Medical): No    Lack of Transportation (Non-Medical): No  Physical Activity: Not on file  Stress: Not on file  Social Connections: Not on file    Allergies:  No Known Allergies  Objective:    Vital Signs:   Temp:  [97.5 F (36.4 C)-98.6 F (37 C)] 98.3 F (36.8 C) (04/29 0751) Pulse Rate:  [0-111] 108 (04/29 0849) Resp:  [0-29] 23 (04/29 0849) BP: (97-125)/(57-96) 116/89 (04/29 0751) SpO2:  [88 %-96  %] 94 % (04/29 0849) Weight:  [93.5 kg-98.7 kg] 93.5 kg (04/29 0849) Last BM Date : 05/15/23  Weight change: Filed Weights   05/16/23 0916 05/17/23 0849  Weight: 98.7 kg 93.5 kg    Intake/Output:   Intake/Output Summary (Last 24 hours) at 05/17/2023 0850 Last data filed at 05/17/2023 0752 Gross per 24 hour  Intake 710 ml  Output 2100 ml  Net -1390 ml      Physical Exam    General:   No resp difficulty. Sitting in the chairl  Neck: supple. JVP 11-2  Cor: PMI nondisplaced. Tachy Regular rate & rhythm. No rubs, gallops. 3/6 LLSB. Lungs: clear Abdomen: soft, nontender, nondistended.  Extremities: no cyanosis, clubbing, rash, R and LLE 1+ edema Neuro: alert & oriented x3   Telemetry   ST 100s  EKG   N/A   Labs   Basic Metabolic Panel: Recent Labs  Lab 05/16/23 1244 05/16/23 1247  NA 141  136 140  K 3.9  3.8 4.0    Liver Function Tests: No results for input(s): "AST", "ALT", "ALKPHOS", "BILITOT", "PROT", "ALBUMIN" in the last 168 hours. No results for input(s): "LIPASE", "AMYLASE" in the last 168 hours. No results for input(s): "AMMONIA" in the last 168 hours.  CBC: Recent Labs  Lab 05/16/23 1244 05/16/23 1247  HGB 14.6  14.3 15.0  HCT 43.0  42.0 44.0    Cardiac Enzymes: No results for input(s): "CKTOTAL", "CKMB", "CKMBINDEX", "TROPONINI" in the last 168 hours.  BNP: BNP (last 3 results) Recent Labs    04/18/23 1618  BNP 566.1*    ProBNP (last 3 results) No results for input(s): "PROBNP" in the last 8760 hours.   CBG: No results for input(s): "GLUCAP" in the last 168 hours.  Coagulation Studies: No results for input(s): "LABPROT", "INR" in the last 72 hours.   Imaging   CARDIAC CATHETERIZATION Result Date: 05/16/2023 Images from the original result were not included.   Prox LAD lesion is 30% stenosed.   Mid LAD lesion is 60% stenosed.   Prox RCA lesion is 30% stenosed.   Recommend Aspirin 81mg  daily for moderate CAD. 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 Exam Location:  Outpatient Procedure: Color Doppler, Cardiac Doppler, 3D Echo and Transesophageal Echo            (Both Spectral and Color Flow Doppler were utilized during            procedure). Indications:     mitral regurgitation  History:         Patient has prior history of Echocardiogram examinations, most                  recent 04/22/2023. Signs/Symptoms:Dyspnea.  Sonographer:     Dione Franks RDCS Referring Phys:  [phone] MIHAI CROITORU Diagnosing Phys: Luana Rumple MD PROCEDURE: After discussion of the risks and benefits of a TEE, an informed consent was obtained from the patient. The transesophogeal probe was passed without difficulty through the esophogus of the patient. Imaged were obtained with the patient in a left lateral decubitus position. Sedation performed by different physician. The patient was monitored while under deep sedation. Anesthestetic sedation was provided intravenously by Anesthesiology: 100mg  of Propofol, 50mg  of Lidocaine. The patient  developed no complications during the procedure.  IMPRESSIONS  1. Left ventricular ejection fraction, by estimation, is 50 to 55%. The left ventricle has low normal function. There is the interventricular septum is flattened in systole and diastole, consistent with right ventricular pressure and volume overload.  2. Right ventricular systolic function is severely reduced. The right ventricular size is moderately enlarged. There is normal pulmonary artery systolic pressure. The estimated right ventricular systolic pressure is 35.8 mmHg.  3. Left atrial size was severely dilated. No left atrial/left atrial appendage thrombus was detected.  4. Right atrial size was severely dilated.  5. 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 The flail gap is > 8 mm. The flail width is 2 cm. The  posterior leaflet is broad (16 mm). The mitral valve area by planimetry is > 6.2 cm sq. The Calvin is extremely severe. By the PISA method, the effective regurgitant orifice area is 1.45 cm sq, regurgitant volume is 108 ml, regurgitant fraction 85%. . The mitral valve is myxomatous. There is torrential mitral valve regurgitation. No evidence of mitral stenosis. The mean mitral valve gradient is 2.3 mmHg with average heart rate of 101 bpm.  6. The tricuspid valve is abnormal. Tricuspid valve regurgitation is moderate.  7. The aortic valve is tricuspid. Aortic valve regurgitation is not visualized.  8. There is spontaneous echo contrast in the descending aorta, consistent with very low cardiac output.  9. Evidence of atrial level shunting detected by color flow Doppler. There is a small patent foramen ovale with bidirectional shunting across atrial septum. 10. 3D performed of the mitral valve  and demonstrates flail scallops of the posterior leaflet due to ruptured chordae, with torrential mitral  insufficiency. FINDINGS  Left Ventricle: Left ventricular ejection fraction, by estimation, is 50 to 55%. The left ventricle has low normal function. The left ventricular internal cavity size was normal in size. There is no left ventricular hypertrophy. The interventricular septum is flattened in systole and diastole, consistent with right ventricular pressure and volume overload. Right Ventricle: The right ventricular size is moderately enlarged. No increase in right ventricular wall thickness. Right ventricular systolic function is severely reduced. There is normal pulmonary artery systolic pressure. The tricuspid regurgitant velocity is 2.28 m/s, and with an assumed right atrial pressure of 15 mmHg, the estimated right ventricular systolic pressure is 35.8 mmHg. Left Atrium: Left atrial size was severely dilated. No left atrial/left atrial appendage thrombus was detected. Right Atrium: Right atrial size was severely dilated. Pericardium: There is no evidence of pericardial effusion. 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 The flail gap is > 8 mm. The flail width is  2 cm. The posterior leaflet is broad (16 mm). The mitral valve area by planimetry is > 6.2 cm sq. The Calvin is extremely severe. By the PISA method, the effective regurgitant orifice area is 1.45 cm sq, regurgitant volume is 108 ml, regurgitant fraction 85%. The mitral valve is myxomatous. There is torrential mitral valve regurgitation, with eccentric anteriorly directed jet. No evidence of mitral valve stenosis. The mean mitral valve gradient is 2.3 mmHg with average heart rate of 101 bpm. Tricuspid Valve: The tricuspid valve is abnormal. Tricuspid valve regurgitation is moderate. Aortic Valve: The aortic valve is tricuspid. Aortic valve regurgitation is not visualized. Pulmonic Valve:  The pulmonic valve was grossly normal. Pulmonic valve regurgitation is trivial. No evidence of pulmonic stenosis. Aorta: There is spontaneous echo contrast in the descending aorta, consistent with very low cardiac output. The aortic root, ascending aorta and aortic arch are all structurally normal, with no evidence of dilitation or obstruction. Venous: A pattern of systolic flow reversal, suggestive of severe mitral regurgitation is recorded from the right upper pulmonary vein and the left upper pulmonary vein. IAS/Shunts: Evidence of atrial level shunting detected by color flow Doppler. A small patent foramen ovale is detected with bidirectional shunting across atrial septum.  LEFT VENTRICLE PLAX 2D LVOT diam:     2.20 cm LV SV:         18 LV SV Index:   9 LVOT Area:     3.80 cm  AORTIC VALVE LVOT Vmax:   49.10 cm/s LVOT Vmean:  28.900 cm/s LVOT VTI:    0.048 m MITRAL VALVE                 TRICUSPID VALVE MV Area (plan): 6.21 cm     TR Peak grad:   20.8 mmHg MV Mean grad:   2.3 mmHg     TR Vmax:        228.00 cm/s Calvin Peak grad:   52.6 mmHg Calvin Vmax:        362.77 cm/s  SHUNTS Calvin Vmean:       245.4 cm/s   Systemic VTI:  0.05 m Calvin PISA:        15.99 cm    Systemic Diam: 2.20 cm Calvin PISA Radius: 1.60 cm Karyl Paget Croitoru MD Electronically signed by Karyl Paget  Croitoru MD Signature Date/Time: 05/16/2023/1:10:10 PM    Final      Medications:     Current Medications:  aspirin EC  81 mg Oral Daily   furosemide   80 mg Intravenous BID   melatonin  3 mg Oral QHS   rosuvastatin  40 mg Oral Daily   sodium chloride flush  3 mL Intravenous Q12H    Infusions:  sodium chloride     milrinone        Patient Profile   Admitted with severe Calvin for work up.   Advanced Heart Team consulted for optimization prior to surgery.   Assessment/Plan  1. 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 RV severely reduced MV Severe Calvin RHC with elevated filling pressures, PA sat 50%, CO 3 and CI 1.4.  Adding milrinone 0.25 mcg.   Volume overloaded. Increase lasix  to 80 mg twice a day.  Plan for RHC tomorrow to ensure optimization prior to surgery.  Needs daily weight strict I/Os.  BMET pending.   2. Mitral Regurgitation Severe Calvin. Ruptured Chordae Flail P3 and P2 CT surgery following with plan for MV repair. Needs additional optimization.    3. CAD Cath nonobstructive CAD Continue aspirin + statin.    Length of Stay: 1  Aveleen Nevers, NP  05/17/2023, 8:50 AM    Advanced Heart Failure Team Pager [phone] (M-F; 7a - 5p)  Please contact [facility] for night-coverage after hours (4p -7a ) and weekends on [email or web address]

## 2023-05-17 NOTE — Plan of Care (Signed)
  Problem: Cardiovascular: Goal: Vascular access site(s) Level 0-1 will be maintained Outcome: Progressing   Problem: Health Behavior/Discharge Planning: Goal: Ability to safely manage health-related needs after discharge will improve Outcome: Progressing   Problem: Education: Goal: Knowledge of General Education information will improve Description: Including pain rating scale, medication(s)/side effects and non-pharmacologic comfort measures Outcome: Progressing   Problem: Health Behavior/Discharge Planning: Goal: Ability to manage health-related needs will improve Outcome: Progressing   Problem: Clinical Measurements: Goal: Will remain free from infection Outcome: Progressing Goal: Diagnostic test results will improve Outcome: Progressing

## 2023-05-17 NOTE — TOC Initial Note (Signed)
 Transition of Care [facility]) - Initial/Assessment Note    Patient Details  Name: Calvin Drake MRN: [id number] Date of Birth: Aug 14, 1960  [facility] CM/SW Contact:    Benjiman Bras, RN Phone Number:[phone] 05/17/2023, 3:14 PM  Clinical Narrative:                  TOC CM spoke to pt's wife, Amy. Pt does not have insurance. Pt does not want to screen for Medicaid. He is self employed and looking into insurance. Will use [facility] for medications. Pt has PCP. Will continue to follow for dc needs.     Expected Discharge Plan: Home/Self Care Barriers to Discharge: Continued Medical Work up   Patient Goals and CMS Choice            Expected Discharge Plan and Services   Discharge Planning Services: CM Consult   Living arrangements for the past 2 months: Single Family Home                                      Prior Living Arrangements/Services Living arrangements for the past 2 months: Single Family Home Lives with:: Spouse Patient language and need for interpreter reviewed:: Yes        Need for Family Participation in Patient Care: No (Comment) Care giver support system in place?: Yes (comment)   Criminal Activity/Legal Involvement Pertinent to Current Situation/Hospitalization: No - Comment as needed  Activities of Daily Living   ADL Screening (condition at time of admission) Independently performs ADLs?: Yes (appropriate for developmental age) Is the patient deaf or have difficulty hearing?: No Does the patient have difficulty seeing, even when wearing glasses/contacts?: No Does the patient have difficulty concentrating, remembering, or making decisions?: No  Permission Sought/Granted Permission sought to share information with : Case Manager, Family Supports, PCP Permission granted to share information with : Yes, Verbal Permission Granted  Share Information with NAME: Amy Gilly     Permission granted to share info w Relationship:  wife  Permission granted to share info w Contact Information: [phone]  Emotional Assessment              Admission diagnosis:  Nonrheumatic mitral valve regurgitation [I34.0] Patient Active Problem List   Diagnosis Date Noted   Nonrheumatic mitral valve regurgitation 05/16/2023   Severe mitral regurgitation 04/28/2023   Pre-procedural cardiovascular examination 04/28/2023   Exertional dyspnea 04/18/2023   Orthopnea 04/18/2023   PND (paroxysmal nocturnal dyspnea) 04/18/2023   PCP:  [facility] Pharmacy:   [id number] [place] [place] - [address] [facility] [place] [facility] [place] [address] [place] [address] Phone: [phone] Fax: [phone]  [facility] [address] [place] [address] Phone: [phone] Fax: [phone]     Social Drivers of Health (SDOH) Social History: SDOH Screenings   Food Insecurity: No Food Insecurity (05/17/2023)  Housing: Low Risk  (05/17/2023)  Transportation Needs: No Transportation Needs (05/17/2023)  Utilities: Not At Risk (05/17/2023)  Tobacco Use: Low Risk  (05/16/2023)   SDOH Interventions:     Readmission Risk Interventions     No data to display

## 2023-05-17 NOTE — Progress Notes (Signed)
 Rounding Note    Patient Name: Calvin Drake Date of Encounter: 05/17/2023  [facility] Cardiologist: Cody Das, MD   Subjective   No dyspnea or CP  Inpatient Medications    Scheduled Meds:  furosemide   40 mg Intravenous BID   melatonin  3 mg Oral QHS   sodium chloride flush  3 mL Intravenous Q12H   Continuous Infusions:  sodium chloride     PRN Meds: sodium chloride, acetaminophen, ondansetron (ZOFRAN) IV, sodium chloride flush   Vital Signs    Vitals:   05/16/23 2024 05/16/23 2328 05/16/23 2329 05/17/23 0439  BP:  117/87  117/80  Pulse: (!) 106 (!) 104 (!) 104 100  Resp: 20 (!) 26 20 (!) 24  Temp:  97.8 F (36.6 C)  97.9 F (36.6 C)  TempSrc:  Oral  Oral  SpO2: 92% 95% 95% 94%  Weight:      Height:        Intake/Output Summary (Last 24 hours) at 05/17/2023 0736 Last data filed at 05/16/2023 2300 Gross per 24 hour  Intake 470 ml  Output 2100 ml  Net -1630 ml      05/16/2023    9:16 AM 04/28/2023   10:04 AM 04/18/2023    2:41 PM  Last 3 Weights  Weight (lbs) 217 lb 9.5 oz 217 lb 9.6 oz 213 lb  Weight (kg) 98.7 kg 98.703 kg 96.616 kg      Telemetry    Sinus with occasional PVC- Personally Reviewed  Physical Exam   GEN: No acute distress.   Neck: No JVD Cardiac: RRR 2/6 systolic murmur apex Respiratory: Clear to auscultation bilaterally. GI: Soft, nontender, non-distended  MS: No edema Neuro:  Nonfocal  Psych: Normal affect   Labs     Chemistry Recent Labs  Lab 05/16/23 1244 05/16/23 1247  NA 141  136 140  K 3.9  3.8 4.0    Hematology Recent Labs  Lab 05/16/23 1244 05/16/23 1247  HGB 14.6  14.3 15.0  HCT 43.0  42.0 44.0    Radiology    CARDIAC CATHETERIZATION Result Date: 05/16/2023 Images from the original result were not included.   Prox LAD lesion is 30% stenosed.   Mid LAD lesion is 60% stenosed.   Prox RCA lesion is 30% stenosed.   Recommend Aspirin 81mg  daily for moderate CAD. 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 Exam Location:  Outpatient Procedure: Color Doppler, Cardiac Doppler, 3D Echo and Transesophageal Echo            (Both Spectral and Color Flow Doppler were utilized during            procedure). Indications:     mitral regurgitation  History:         Patient has prior history of Echocardiogram examinations, most                  recent 04/22/2023. Signs/Symptoms:Dyspnea.  Sonographer:     Dione Franks RDCS Referring Phys:  [phone] MIHAI CROITORU Diagnosing Phys: Luana Rumple MD PROCEDURE: After discussion of the risks and benefits of a TEE, an informed consent was obtained from the patient. The transesophogeal probe was passed without difficulty through the esophogus of the patient. Imaged were obtained with the patient in a left lateral decubitus position. Sedation performed by different physician. The patient was monitored while under deep sedation. Anesthestetic sedation was provided intravenously by Anesthesiology: 100mg  of Propofol, 50mg  of Lidocaine. The patient  developed no complications during the procedure.  IMPRESSIONS  1. Left ventricular ejection fraction, by estimation, is 50 to 55%. The left ventricle has low normal function. There is the interventricular septum is flattened in systole and diastole, consistent with right ventricular pressure and volume overload.  2. Right ventricular systolic function is severely reduced. The right ventricular size is moderately enlarged. There is normal pulmonary artery systolic pressure. The estimated right ventricular systolic pressure is 35.8 mmHg.  3. Left atrial size was severely dilated. No left atrial/left atrial appendage thrombus was detected.  4. Right atrial size was severely dilated.  5. 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 The flail gap is > 8 mm. The flail width is 2 cm. The  posterior leaflet is broad (16 mm). The mitral valve area by planimetry is > 6.2 cm sq. The MR is extremely severe. By the PISA method, the effective regurgitant orifice area is 1.45 cm sq, regurgitant volume is 108 ml, regurgitant fraction 85%. . The mitral valve is myxomatous. There is torrential mitral valve regurgitation. No evidence of mitral stenosis. The mean mitral valve gradient is 2.3 mmHg with average heart rate of 101 bpm.  6. The tricuspid valve is abnormal. Tricuspid valve regurgitation is moderate.  7. The aortic valve is tricuspid. Aortic valve regurgitation is not visualized.  8. There is spontaneous echo contrast in the descending aorta, consistent with very low cardiac output.  9. Evidence of atrial level shunting detected by color flow Doppler. There is a small patent foramen ovale with bidirectional shunting across atrial septum. 10. 3D performed of the mitral valve and demonstrates flail scallops of the posterior leaflet due to ruptured chordae, with torrential mitral  insufficiency. FINDINGS  Left Ventricle: Left ventricular ejection fraction, by estimation, is 50 to 55%. The left ventricle has low normal function. The left ventricular internal cavity size was normal in size. There is no left ventricular hypertrophy. The interventricular septum is flattened in systole and diastole, consistent with right ventricular pressure and volume overload. Right Ventricle: The right ventricular size is moderately enlarged. No increase in right ventricular wall  thickness. Right ventricular systolic function is severely reduced. There is normal pulmonary artery systolic pressure. The tricuspid regurgitant velocity is 2.28 m/s, and with an assumed right atrial pressure of 15 mmHg, the estimated right ventricular systolic pressure is 35.8 mmHg. Left Atrium: Left atrial size was severely dilated. No left atrial/left atrial appendage thrombus was detected. Right Atrium: Right atrial size was severely dilated. Pericardium: There is no evidence of pericardial effusion. 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 The flail gap is > 8 mm. The flail width is  2 cm. The posterior leaflet is broad (16 mm). The mitral valve area by planimetry is > 6.2 cm sq. The MR is extremely severe. By the PISA method, the effective regurgitant orifice area is 1.45 cm sq, regurgitant volume is 108 ml, regurgitant fraction 85%. The mitral valve is myxomatous. There is torrential mitral valve regurgitation, with eccentric anteriorly directed jet. No evidence of mitral valve stenosis. The mean mitral valve gradient is 2.3 mmHg with average heart rate of 101 bpm. Tricuspid Valve: The tricuspid valve is abnormal. Tricuspid valve regurgitation is moderate. Aortic Valve: The aortic valve is tricuspid. Aortic valve regurgitation is not visualized. Pulmonic Valve:  The pulmonic valve was grossly normal. Pulmonic valve regurgitation is trivial. No evidence of pulmonic stenosis. Aorta: There is spontaneous echo contrast in the descending aorta, consistent with very low cardiac output. The aortic root, ascending aorta and aortic arch are all structurally normal, with no evidence of dilitation or obstruction. Venous: A pattern of systolic flow reversal, suggestive of severe mitral regurgitation is recorded from the right upper pulmonary vein and the left upper pulmonary vein. IAS/Shunts: Evidence of atrial level shunting detected by color flow Doppler. A small patent foramen ovale is detected with bidirectional shunting across atrial septum.  LEFT VENTRICLE PLAX 2D LVOT diam:     2.20 cm LV SV:         18 LV SV Index:   9 LVOT Area:     3.80 cm  AORTIC VALVE LVOT Vmax:   49.10 cm/s LVOT Vmean:  28.900 cm/s LVOT VTI:    0.048 m MITRAL VALVE                 TRICUSPID VALVE MV Area (plan): 6.21 cm     TR Peak grad:   20.8 mmHg MV Mean grad:   2.3 mmHg     TR Vmax:        228.00 cm/s MR Peak grad:   52.6 mmHg MR Vmax:        362.77 cm/s  SHUNTS MR Vmean:       245.4 cm/s   Systemic VTI:  0.05 m MR PISA:        15.99 cm    Systemic Diam: 2.20 cm MR PISA Radius: 1.60 cm Karyl Paget Croitoru MD Electronically signed by Luana Rumple MD Signature Date/Time: 05/16/2023/1:10:10 PM    Final    EP STUDY Result Date: 05/16/2023 See surgical note for result.     Patient Profile     63 y.o. male with severe MR. Echocardiogram April 2020 times showed normal LV function, moderate right ventricular enlargement, moderate pulmonary hypertension, severe biatrial enlargement, flail segment of the posterior mitral valve leaflet with severe mitral regurgitation.  Transesophageal echocardiogram showed normal LV function, flattening of interventricular septum in systole and diastole consistent with  RV pressure/volume overload, moderate right ventricular enlargement, severe RV dysfunction, severe  left atrial enlargement, severe right atrial enlargement, flail P3 lateral and medial half of P2, severe mitral regurgitation, moderate tricuspid regurgitation, PFO.  Cardiac catheterization April 2025 showed 60% mid LAD and 30% proximal RCA, left ventricular end-diastolic pressure 28 mmHg, PA pressure 73/35, cardiac index 1.47.  Assessment & Plan    1 severe mitral regurgitation -transesophageal echocardiogram yesterday revealed flail P3 and medial half of P2 with severe mitral regurgitation.  Patient is extremely symptomatic.  Cardiac catheterization revealed moderate nonobstructive coronary artery disease.  Elevated left ventricular end-diastolic pressure as well as pulmonary hypertension. Will continue Lasix  at present dose.  Follow renal function.  Will have cardiothoracic surgery evaluate for mitral valve repair.  2 coronary artery disease-moderate on catheterization yesterday.  Will treat with aspirin 81 mg daily and Crestor 40 mg daily.  3 elevated left ventricular end-diastolic pressure/pulmonary hypertension -secondary to severe mitral regurgitation.  Continue diuresis as outlined above.  For questions or updates, please contact [facility] Please consult [email or web address] for contact info under        Signed, Alexandria Angel, MD  05/17/2023, 7:36 AM

## 2023-05-17 NOTE — H&P (View-Only) (Signed)
 Reason for Consult:severe MR Referring Physician: Dr. Marni Sins Calvin Drake is an 63 y.o. male.  HPI: 63 yo man with exertional SOB and fatigue  Mr Calvin Drake is a 63 yo man with no prior cardiac history until early this year.  He initially became ill around the first of the year.  Felt short of breath and fatigue with minimal activities.  Thought it was a respiratory infection at first although never had fever or productive cough.  "Stayed in bed for 3 weeks" before wife made him go to the doctor.  Found to be in heart failure.    Continues to have dyspnea with even minimal exertion and fatigue.  Unable to sleep at night.  Echo showed severe MR.  Yesterday had cath which showed moderate CAD and markedly elevated Pa and right heart pressures.  Admitted for diuresis.  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 No pertinent family history.  Social History:  reports that he has never smoked. He has never used smokeless tobacco. No history on file for alcohol use and drug use.  Allergies: No Known Allergies  Medications: Scheduled:  furosemide   40 mg Intravenous BID   melatonin  3 mg Oral QHS   sodium chloride flush  3 mL Intravenous Q12H    Results for orders placed or performed during the hospital encounter of 05/16/23 (from the past 48 hours)  POCT I-Stat EG7      Status: Abnormal   Collection Time: 05/16/23 12:44 PM  Result Value Ref Range   pH, Ven 7.368 7.25 - 7.43   pCO2, Ven 42.2 (L) 44 - 60 mmHg   pO2, Ven 26 (LL) 32 - 45 mmHg   Bicarbonate 24.3 20.0 - 28.0 mmol/L   TCO2 26 22 - 32 mmol/L   O2 Saturation 47 %   Acid-base deficit 1.0 0.0 - 2.0 mmol/L   Sodium 136 135 - 145 mmol/L   Potassium 3.8 3.5 - 5.1 mmol/L   Calcium, Ion 1.13 (L) 1.15 - 1.40 mmol/L   HCT 42.0 39.0 - 52.0 %   Hemoglobin 14.3 13.0 - 17.0 g/dL   Sample type VENOUS    Comment NOTIFIED PHYSICIAN   I-STAT 7, (LYTES, BLD GAS, ICA, H+H)     Status: Abnormal   Collection Time: 05/16/23 12:44 PM  Result Value Ref Range   pH, Arterial 7.431 7.35 - 7.45   pCO2 arterial 33.2 32 - 48 mmHg   pO2, Arterial 65 (L) 83 - 108 mmHg   Bicarbonate 22.1 20.0 - 28.0 mmol/L   TCO2 23 22 - 32 mmol/L   O2 Saturation 93 %   Acid-base deficit 1.0 0.0 - 2.0 mmol/L   Sodium 141 135 - 145 mmol/L   Potassium 3.9 3.5 - 5.1 mmol/L   Calcium, Ion 1.13 (L) 1.15 - 1.40 mmol/L   HCT 43.0 39.0 - 52.0 %   Hemoglobin 14.6 13.0 - 17.0  g/dL   Sample type ARTERIAL   POCT I-Stat EG7     Status: Abnormal   Collection Time: 05/16/23 12:47 PM  Result Value Ref Range   pH, Ven 7.391 7.25 - 7.43   pCO2, Ven 42.0 (L) 44 - 60 mmHg   pO2, Ven 28 (LL) 32 - 45 mmHg   Bicarbonate 25.5 20.0 - 28.0 mmol/L   TCO2 27 22 - 32 mmol/L   O2 Saturation 53 %   Acid-Base Excess 0.0 0.0 - 2.0 mmol/L   Sodium 140 135 - 145 mmol/L   Potassium 4.0 3.5 - 5.1 mmol/L   Calcium, Ion 1.19 1.15 - 1.40 mmol/L   HCT 44.0 39.0 - 52.0 %   Hemoglobin 15.0 13.0 - 17.0 g/dL   Sample type VENOUS    Comment NOTIFIED PHYSICIAN     CARDIAC CATHETERIZATION Result Date: 05/16/2023 Images from the original result were not included.   Prox LAD lesion is 30% stenosed.   Mid LAD lesion is 60% stenosed.   Prox RCA lesion is 30% stenosed.   Recommend Aspirin 81mg  daily for moderate CAD. 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 Exam Location:  Outpatient Procedure: Color Doppler, Cardiac Doppler, 3D Echo and Transesophageal Echo            (Both Spectral and Color Flow Doppler were utilized during            procedure). Indications:     mitral regurgitation  History:         Patient has prior history of Echocardiogram examinations, most                  recent 04/22/2023. Signs/Symptoms:Dyspnea.  Sonographer:     Dione Franks RDCS Referring Phys:  [phone] MIHAI CROITORU Diagnosing Phys: Luana Rumple MD PROCEDURE: After discussion of the risks and benefits of a TEE, an informed consent was obtained from the patient. The transesophogeal probe was passed without difficulty through the esophogus of the patient. Imaged were obtained with the patient in a left lateral decubitus position. Sedation performed by different physician. The patient was monitored while under deep sedation. Anesthestetic sedation was provided intravenously by Anesthesiology: 100mg  of Propofol, 50mg  of Lidocaine.  The patient developed no complications during the  procedure.  IMPRESSIONS  1. Left ventricular ejection fraction, by estimation, is 50 to 55%. The left ventricle has low normal function. There is the interventricular septum is flattened in systole and diastole, consistent with right ventricular pressure and volume overload.  2. Right ventricular systolic function is severely reduced. The right ventricular size is moderately enlarged. There is normal pulmonary artery systolic pressure. The estimated right ventricular systolic pressure is 35.8 mmHg.  3. Left atrial size was severely dilated. No left atrial/left atrial appendage thrombus was detected.  4. Right atrial size was severely dilated.  5. 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 The flail gap is > 8 mm. The flail width is 2 cm. The  posterior leaflet is broad (16 mm). The mitral valve area by planimetry is > 6.2 cm sq. The MR is extremely severe. By the PISA method, the effective regurgitant orifice area is 1.45 cm sq, regurgitant volume is 108 ml, regurgitant fraction 85%. . The mitral valve is myxomatous. There is torrential mitral valve regurgitation. No evidence of mitral stenosis. The mean mitral valve gradient is 2.3 mmHg with average heart rate of 101 bpm.  6. The tricuspid valve is abnormal. Tricuspid valve regurgitation is moderate.  7. The aortic valve is tricuspid. Aortic valve regurgitation is not visualized.  8. There is spontaneous echo contrast in the descending aorta, consistent with very low cardiac output.  9. Evidence of atrial level shunting detected by color flow Doppler. There is a small patent foramen ovale with bidirectional shunting across atrial septum. 10. 3D performed of the mitral valve and demonstrates flail scallops of the posterior leaflet due to ruptured chordae, with torrential mitral insufficiency. FINDINGS  Left  Ventricle: Left ventricular ejection fraction, by estimation, is 50 to 55%. The left ventricle has low normal function. The left ventricular internal cavity size was normal in size. There is no left ventricular hypertrophy. The interventricular septum is flattened in systole and diastole, consistent with right ventricular pressure and volume overload. Right Ventricle: The right ventricular size is moderately enlarged. No increase in right ventricular wall thickness. Right ventricular systolic function is severely reduced. There is normal pulmonary artery systolic pressure. The tricuspid regurgitant velocity is 2.28 m/s, and with an assumed right atrial pressure of 15 mmHg, the estimated right ventricular systolic pressure is 35.8 mmHg. Left Atrium: Left atrial size was severely dilated. No left atrial/left atrial appendage thrombus was detected. Right Atrium: Right atrial size was severely dilated. Pericardium: There is no evidence of pericardial effusion. 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 The flail gap is > 8 mm. The flail width is  2 cm. The posterior leaflet is broad (16 mm). The mitral valve area by planimetry is > 6.2 cm sq. The MR is extremely severe. By the PISA method, the effective regurgitant orifice area is 1.45 cm sq, regurgitant volume is 108 ml, regurgitant fraction 85%. The mitral valve is myxomatous. There is torrential mitral valve regurgitation, with eccentric anteriorly directed jet. No evidence of mitral valve stenosis. The mean mitral valve gradient is 2.3 mmHg with average heart rate of 101 bpm. Tricuspid Valve: The tricuspid valve is abnormal. Tricuspid valve regurgitation is  moderate. Aortic Valve: The aortic valve is tricuspid. Aortic valve regurgitation is not visualized. Pulmonic Valve: The pulmonic valve was  grossly normal. Pulmonic valve regurgitation is trivial. No evidence of pulmonic stenosis. Aorta: There is spontaneous echo contrast in the descending aorta, consistent with very low cardiac output. The aortic root, ascending aorta and aortic arch are all structurally normal, with no evidence of dilitation or obstruction. Venous: A pattern of systolic flow reversal, suggestive of severe mitral regurgitation is recorded from the right upper pulmonary vein and the left upper pulmonary vein. IAS/Shunts: Evidence of atrial level shunting detected by color flow Doppler. A small patent foramen ovale is detected with bidirectional shunting across atrial septum.  LEFT VENTRICLE PLAX 2D LVOT diam:     2.20 cm LV SV:         18 LV SV Index:   9 LVOT Area:     3.80 cm  AORTIC VALVE LVOT Vmax:   49.10 cm/s LVOT Vmean:  28.900 cm/s LVOT VTI:    0.048 m MITRAL VALVE                 TRICUSPID VALVE MV Area (plan): 6.21 cm     TR Peak grad:   20.8 mmHg MV Mean grad:   2.3 mmHg     TR Vmax:        228.00 cm/s MR Peak grad:   52.6 mmHg MR Vmax:        362.77 cm/s  SHUNTS MR Vmean:       245.4 cm/s   Systemic VTI:  0.05 m MR PISA:        15.99 cm    Systemic Diam: 2.20 cm MR PISA Radius: 1.60 cm Karyl Paget Croitoru MD Electronically signed by Luana Rumple MD Signature Date/Time: 05/16/2023/1:10:10 PM    Final    EP STUDY Result Date: 05/16/2023 See surgical note for result.  I personally reviewed the cath and echo images.  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  Respiratory:  Positive for shortness of breath.   Cardiovascular:  Positive for leg swelling.  Neurological:  Negative for seizures and syncope.  Hematological:  Negative for adenopathy. Does not bruise/bleed easily.   Blood pressure 117/80, pulse 100, temperature 97.9 F (36.6 C), temperature source Oral, resp. rate (!) 24, height 5\' 7"  (1.702 m), weight 98.7 kg,  SpO2 94%. Physical Exam Vitals reviewed.  Constitutional:      Appearance: Normal appearance.  HENT:     Head: Normocephalic and atraumatic.  Eyes:     General: No scleral icterus.    Extraocular Movements: Extraocular movements intact.  Cardiovascular:     Rate and Rhythm: Normal rate and regular rhythm.     Heart sounds: Murmur (3/6 holosystolic) heard.  Pulmonary:     Effort: No respiratory distress.     Breath sounds: Rales present. No wheezing.  Abdominal:     General: There is no distension.     Palpations: Abdomen is soft.  Musculoskeletal:     Right lower leg: No edema.     Left lower leg: No edema.  Skin:    General: Skin is warm and dry.  Neurological:     General: No focal deficit present.     Mental Status: He is alert and oriented to person, place, and time.     Cranial Nerves: No cranial nerve deficit.  Motor: No weakness.     Assessment/Plan: Mr. Denbo is a 63 yo man with no significant PMH who presents with a 4 month history of dyspnea and profound fatigue.  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  I discussed the general nature of the procedure, including the need for general anesthesia, the incisions to be used, the use of cardiopulmonary bypass, and the use of temporary pacemaker wires and drainage tubes postoperatively with Mr. Kubick.  We discussed the expected hospital stay, overall recovery and short and long term outcomes. I informed him of the indications, risks, benefits and alternatives.   He understands the risks include, but are not limited to death, stroke, MI, DVT/PE, bleeding, possible need for transfusion, infections, cardiac arrhythmias, complete heart block requiring permanent pacemaker, as well as other organ system dysfunction including respiratory, renal, or GI complications.    Zelphia Higashi 05/17/2023, 7:41 AM

## 2023-05-17 NOTE — Consult Note (Signed)
 Reason for Consult:severe Calvin Referring Physician: Dr. Marni Sins Calvin Drake is an 63 y.o. male.  HPI: 63 yo man with exertional SOB and fatigue  Calvin Drake is a 63 yo man with no prior cardiac history until early this year.  He initially became ill around the first of the year.  Felt short of breath and fatigue with minimal activities.  Thought it was a respiratory infection at first although never had fever or productive cough.  "Stayed in bed for 3 weeks" before wife made him go to the doctor.  Found to be in heart failure.    Continues to have dyspnea with even minimal exertion and fatigue.  Unable to sleep at night.  Echo showed severe Calvin.  Yesterday had cath which showed moderate CAD and markedly elevated Pa and right heart pressures.  Admitted for diuresis.  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 No pertinent family history.  Social History:  reports that he has never smoked. He has never used smokeless tobacco. No history on file for alcohol use and drug use.  Allergies: No Known Allergies  Medications: Scheduled:  furosemide   40 mg Intravenous BID   melatonin  3 mg Oral QHS   sodium chloride flush  3 mL Intravenous Q12H    Results for orders placed or performed during the hospital encounter of 05/16/23 (from the past 48 hours)  POCT I-Stat EG7      Status: Abnormal   Collection Time: 05/16/23 12:44 PM  Result Value Ref Range   pH, Ven 7.368 7.25 - 7.43   pCO2, Ven 42.2 (L) 44 - 60 mmHg   pO2, Ven 26 (LL) 32 - 45 mmHg   Bicarbonate 24.3 20.0 - 28.0 mmol/L   TCO2 26 22 - 32 mmol/L   O2 Saturation 47 %   Acid-base deficit 1.0 0.0 - 2.0 mmol/L   Sodium 136 135 - 145 mmol/L   Potassium 3.8 3.5 - 5.1 mmol/L   Calcium, Ion 1.13 (L) 1.15 - 1.40 mmol/L   HCT 42.0 39.0 - 52.0 %   Hemoglobin 14.3 13.0 - 17.0 g/dL   Sample type VENOUS    Comment NOTIFIED PHYSICIAN   I-STAT 7, (LYTES, BLD GAS, ICA, H+H)     Status: Abnormal   Collection Time: 05/16/23 12:44 PM  Result Value Ref Range   pH, Arterial 7.431 7.35 - 7.45   pCO2 arterial 33.2 32 - 48 mmHg   pO2, Arterial 65 (L) 83 - 108 mmHg   Bicarbonate 22.1 20.0 - 28.0 mmol/L   TCO2 23 22 - 32 mmol/L   O2 Saturation 93 %   Acid-base deficit 1.0 0.0 - 2.0 mmol/L   Sodium 141 135 - 145 mmol/L   Potassium 3.9 3.5 - 5.1 mmol/L   Calcium, Ion 1.13 (L) 1.15 - 1.40 mmol/L   HCT 43.0 39.0 - 52.0 %   Hemoglobin 14.6 13.0 - 17.0  g/dL   Sample type ARTERIAL   POCT I-Stat EG7     Status: Abnormal   Collection Time: 05/16/23 12:47 PM  Result Value Ref Range   pH, Ven 7.391 7.25 - 7.43   pCO2, Ven 42.0 (L) 44 - 60 mmHg   pO2, Ven 28 (LL) 32 - 45 mmHg   Bicarbonate 25.5 20.0 - 28.0 mmol/L   TCO2 27 22 - 32 mmol/L   O2 Saturation 53 %   Acid-Base Excess 0.0 0.0 - 2.0 mmol/L   Sodium 140 135 - 145 mmol/L   Potassium 4.0 3.5 - 5.1 mmol/L   Calcium, Ion 1.19 1.15 - 1.40 mmol/L   HCT 44.0 39.0 - 52.0 %   Hemoglobin 15.0 13.0 - 17.0 g/dL   Sample type VENOUS    Comment NOTIFIED PHYSICIAN     CARDIAC CATHETERIZATION Result Date: 05/16/2023 Images from the original result were not included.   Prox LAD lesion is 30% stenosed.   Mid LAD lesion is 60% stenosed.   Prox RCA lesion is 30% stenosed.   Recommend Aspirin 81mg  daily for moderate CAD. 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 Exam Location:  Outpatient Procedure: Color Doppler, Cardiac Doppler, 3D Echo and Transesophageal Echo            (Both Spectral and Color Flow Doppler were utilized during            procedure). Indications:     mitral regurgitation  History:         Patient has prior history of Echocardiogram examinations, most                  recent 04/22/2023. Signs/Symptoms:Dyspnea.  Sonographer:     Dione Franks RDCS Referring Phys:  [phone] MIHAI CROITORU Diagnosing Phys: Luana Rumple MD PROCEDURE: After discussion of the risks and benefits of a TEE, an informed consent was obtained from the patient. The transesophogeal probe was passed without difficulty through the esophogus of the patient. Imaged were obtained with the patient in a left lateral decubitus position. Sedation performed by different physician. The patient was monitored while under deep sedation. Anesthestetic sedation was provided intravenously by Anesthesiology: 100mg  of Propofol, 50mg  of Lidocaine.  The patient developed no complications during the  procedure.  IMPRESSIONS  1. Left ventricular ejection fraction, by estimation, is 50 to 55%. The left ventricle has low normal function. There is the interventricular septum is flattened in systole and diastole, consistent with right ventricular pressure and volume overload.  2. Right ventricular systolic function is severely reduced. The right ventricular size is moderately enlarged. There is normal pulmonary artery systolic pressure. The estimated right ventricular systolic pressure is 35.8 mmHg.  3. Left atrial size was severely dilated. No left atrial/left atrial appendage thrombus was detected.  4. Right atrial size was severely dilated.  5. 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 The flail gap is > 8 mm. The flail width is 2 cm. The  posterior leaflet is broad (16 mm). The mitral valve area by planimetry is > 6.2 cm sq. The Calvin is extremely severe. By the PISA method, the effective regurgitant orifice area is 1.45 cm sq, regurgitant volume is 108 ml, regurgitant fraction 85%. . The mitral valve is myxomatous. There is torrential mitral valve regurgitation. No evidence of mitral stenosis. The mean mitral valve gradient is 2.3 mmHg with average heart rate of 101 bpm.  6. The tricuspid valve is abnormal. Tricuspid valve regurgitation is moderate.  7. The aortic valve is tricuspid. Aortic valve regurgitation is not visualized.  8. There is spontaneous echo contrast in the descending aorta, consistent with very low cardiac output.  9. Evidence of atrial level shunting detected by color flow Doppler. There is a small patent foramen ovale with bidirectional shunting across atrial septum. 10. 3D performed of the mitral valve and demonstrates flail scallops of the posterior leaflet due to ruptured chordae, with torrential mitral insufficiency. FINDINGS  Left  Ventricle: Left ventricular ejection fraction, by estimation, is 50 to 55%. The left ventricle has low normal function. The left ventricular internal cavity size was normal in size. There is no left ventricular hypertrophy. The interventricular septum is flattened in systole and diastole, consistent with right ventricular pressure and volume overload. Right Ventricle: The right ventricular size is moderately enlarged. No increase in right ventricular wall thickness. Right ventricular systolic function is severely reduced. There is normal pulmonary artery systolic pressure. The tricuspid regurgitant velocity is 2.28 m/s, and with an assumed right atrial pressure of 15 mmHg, the estimated right ventricular systolic pressure is 35.8 mmHg. Left Atrium: Left atrial size was severely dilated. No left atrial/left atrial appendage thrombus was detected. Right Atrium: Right atrial size was severely dilated. Pericardium: There is no evidence of pericardial effusion. 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 The flail gap is > 8 mm. The flail width is  2 cm. The posterior leaflet is broad (16 mm). The mitral valve area by planimetry is > 6.2 cm sq. The Calvin is extremely severe. By the PISA method, the effective regurgitant orifice area is 1.45 cm sq, regurgitant volume is 108 ml, regurgitant fraction 85%. The mitral valve is myxomatous. There is torrential mitral valve regurgitation, with eccentric anteriorly directed jet. No evidence of mitral valve stenosis. The mean mitral valve gradient is 2.3 mmHg with average heart rate of 101 bpm. Tricuspid Valve: The tricuspid valve is abnormal. Tricuspid valve regurgitation is  moderate. Aortic Valve: The aortic valve is tricuspid. Aortic valve regurgitation is not visualized. Pulmonic Valve: The pulmonic valve was  grossly normal. Pulmonic valve regurgitation is trivial. No evidence of pulmonic stenosis. Aorta: There is spontaneous echo contrast in the descending aorta, consistent with very low cardiac output. The aortic root, ascending aorta and aortic arch are all structurally normal, with no evidence of dilitation or obstruction. Venous: A pattern of systolic flow reversal, suggestive of severe mitral regurgitation is recorded from the right upper pulmonary vein and the left upper pulmonary vein. IAS/Shunts: Evidence of atrial level shunting detected by color flow Doppler. A small patent foramen ovale is detected with bidirectional shunting across atrial septum.  LEFT VENTRICLE PLAX 2D LVOT diam:     2.20 cm LV SV:         18 LV SV Index:   9 LVOT Area:     3.80 cm  AORTIC VALVE LVOT Vmax:   49.10 cm/s LVOT Vmean:  28.900 cm/s LVOT VTI:    0.048 m MITRAL VALVE                 TRICUSPID VALVE MV Area (plan): 6.21 cm     TR Peak grad:   20.8 mmHg MV Mean grad:   2.3 mmHg     TR Vmax:        228.00 cm/s Calvin Peak grad:   52.6 mmHg Calvin Vmax:        362.77 cm/s  SHUNTS Calvin Vmean:       245.4 cm/s   Systemic VTI:  0.05 m Calvin PISA:        15.99 cm    Systemic Diam: 2.20 cm Calvin PISA Radius: 1.60 cm Karyl Paget Croitoru MD Electronically signed by Luana Rumple MD Signature Date/Time: 05/16/2023/1:10:10 PM    Final    EP STUDY Result Date: 05/16/2023 See surgical note for result.  I personally reviewed the cath and echo images.  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  Respiratory:  Positive for shortness of breath.   Cardiovascular:  Positive for leg swelling.  Neurological:  Negative for seizures and syncope.  Hematological:  Negative for adenopathy. Does not bruise/bleed easily.   Blood pressure 117/80, pulse 100, temperature 97.9 F (36.6 C), temperature source Oral, resp. rate (!) 24, height 5\' 7"  (1.702 m), weight 98.7 kg,  SpO2 94%. Physical Exam Vitals reviewed.  Constitutional:      Appearance: Normal appearance.  HENT:     Head: Normocephalic and atraumatic.  Eyes:     General: No scleral icterus.    Extraocular Movements: Extraocular movements intact.  Cardiovascular:     Rate and Rhythm: Normal rate and regular rhythm.     Heart sounds: Murmur (3/6 holosystolic) heard.  Pulmonary:     Effort: No respiratory distress.     Breath sounds: Rales present. No wheezing.  Abdominal:     General: There is no distension.     Palpations: Abdomen is soft.  Musculoskeletal:     Right lower leg: No edema.     Left lower leg: No edema.  Skin:    General: Skin is warm and dry.  Neurological:     General: No focal deficit present.     Mental Status: He is alert and oriented to person, place, and time.     Cranial Nerves: No cranial nerve deficit.  Motor: No weakness.     Assessment/Plan: Calvin Drake is a 63 yo man with no significant PMH who presents with a 4 month history of dyspnea and profound fatigue.  Found to be in NYHA class IV heart failure secondary to severe Calvin  Mitral repair (possible replacement) indicated for survival benefit and relief of symptoms.  I discussed the general nature of the procedure, including the need for general anesthesia, the incisions to be used, the use of cardiopulmonary bypass, and the use of temporary pacemaker wires and drainage tubes postoperatively with Calvin Drake.  We discussed the expected hospital stay, overall recovery and short and long term outcomes. I informed him of the indications, risks, benefits and alternatives.   He understands the risks include, but are not limited to death, stroke, MI, DVT/PE, bleeding, possible need for transfusion, infections, cardiac arrhythmias, complete heart block requiring permanent pacemaker, as well as other organ system dysfunction including respiratory, renal, or GI complications.    Zelphia Higashi 05/17/2023, 7:41 AM

## 2023-05-17 NOTE — Progress Notes (Signed)
 Carotid arterial duplex completed. Please see CV Procedures for preliminary results.  Estanislao Heimlich, RVT 05/17/23 1:03 PM

## 2023-05-18 DIAGNOSIS — I340 Nonrheumatic mitral (valve) insufficiency: Principal | ICD-10-CM

## 2023-05-18 LAB — BASIC METABOLIC PANEL WITH GFR
Anion gap: 14 (ref 5–15)
Anion gap: 14 (ref 5–15)
BUN: 18 mg/dL (ref 8–23)
BUN: 20 mg/dL (ref 8–23)
CO2: 28 mmol/L (ref 22–32)
CO2: 29 mmol/L (ref 22–32)
Calcium: 9.5 mg/dL (ref 8.9–10.3)
Calcium: 10.1 mg/dL (ref 8.9–10.3)
Chloride: 92 mmol/L — ABNORMAL LOW (ref 98–111)
Chloride: 88 mmol/L — ABNORMAL LOW (ref 98–111)
Creatinine, Ser: 1.2 mg/dL (ref 0.61–1.24)
Creatinine, Ser: 1.34 mg/dL — ABNORMAL HIGH (ref 0.61–1.24)
GFR, Estimated: >60 mL/min (ref >60)
GFR, Estimated: >60 mL/min (ref >60)
Glucose, Bld: 107 mg/dL — ABNORMAL HIGH (ref 70–99)
Glucose, Bld: 128 mg/dL — ABNORMAL HIGH (ref 70–99)
Potassium: 3.2 mmol/L — ABNORMAL LOW (ref 3.5–5.1)
Potassium: 3.7 mmol/L (ref 3.5–5.1)
Sodium: 134 mmol/L — ABNORMAL LOW (ref 135–145)
Sodium: 131 mmol/L — ABNORMAL LOW (ref 135–145)

## 2023-05-18 LAB — LACTIC ACID, PLASMA: Lactic Acid, Venous: 1.2 mmol/L (ref 0.5–1.9)

## 2023-05-18 LAB — MAGNESIUM: Magnesium: 2 mg/dL (ref 1.7–2.4)

## 2023-05-18 LAB — LIPOPROTEIN A (LPA): Lipoprotein (a): 31.1 nmol/L — ABNORMAL HIGH (ref <75.0)

## 2023-05-18 MED ORDER — HYDRALAZINE HCL 10 MG PO TABS
10.0000 mg | ORAL_TABLET | Freq: Three times a day (TID) | ORAL | Status: DC
  Administered 2023-05-18 – 2023-05-22 (×12): 10 mg via ORAL
  Filled 2023-05-18 (×14): qty 1

## 2023-05-18 MED ORDER — FUROSEMIDE 10 MG/ML IJ SOLN
20.0000 mg | Freq: Two times a day (BID) | INTRAMUSCULAR | Status: DC
  Administered 2023-05-19 – 2023-05-20 (×3): 20 mg via INTRAVENOUS
  Filled 2023-05-18 (×3): qty 2

## 2023-05-18 MED ORDER — MAGNESIUM SULFATE 2 GM/50ML IV SOLN
2.0000 g | Freq: Once | INTRAVENOUS | Status: AC
  Administered 2023-05-18: 2 g via INTRAVENOUS
  Filled 2023-05-18: qty 50

## 2023-05-18 MED ORDER — POTASSIUM CHLORIDE CRYS ER 20 MEQ PO TBCR
40.0000 meq | EXTENDED_RELEASE_TABLET | Freq: Two times a day (BID) | ORAL | Status: DC
  Administered 2023-05-18 – 2023-05-20 (×5): 40 meq via ORAL
  Filled 2023-05-18 (×5): qty 2

## 2023-05-18 MED ORDER — SPIRONOLACTONE 25 MG PO TABS
25.0000 mg | ORAL_TABLET | Freq: Every day | ORAL | Status: DC
  Administered 2023-05-19 – 2023-05-22 (×4): 25 mg via ORAL
  Filled 2023-05-18 (×4): qty 1

## 2023-05-18 MED ORDER — SPIRONOLACTONE 12.5 MG HALF TABLET
12.5000 mg | ORAL_TABLET | Freq: Every day | ORAL | Status: DC
  Administered 2023-05-18: 12.5 mg via ORAL
  Filled 2023-05-18: qty 1

## 2023-05-18 MED ORDER — CYCLOBENZAPRINE HCL 10 MG PO TABS
5.0000 mg | ORAL_TABLET | Freq: Every day | ORAL | Status: DC
  Administered 2023-05-18 – 2023-06-01 (×15): 5 mg via ORAL
  Filled 2023-05-18 (×15): qty 1

## 2023-05-18 NOTE — Progress Notes (Signed)
 Pt was educated on incentive spirometer this morning, When asked to demonstrate understanding of how to use the device the pt refused and said he would use it later. When Pt was asked to use the incentive spirometer later in the day and pt stated he was to tired today to use the device today and he would try to use it on night shift. Pt was educated on the importance of the devise and how it will help to increase his O2 levels.

## 2023-05-18 NOTE — Progress Notes (Signed)
 Mobility Specialist Progress Note;   05/18/23 1050  Mobility  Activity Ambulated independently in hallway  Level of Assistance Standby assist, set-up cues, supervision of patient - no hands on  Assistive Device None  Distance Ambulated (ft) 400 ft  Range of Motion/Exercises Active Assistive  Activity Response Tolerated well  Mobility Referral Yes  Mobility visit 1 Mobility  Mobility Specialist Start Time (ACUTE ONLY) 1050  Mobility Specialist Stop Time (ACUTE ONLY) 1125  Mobility Specialist Time Calculation (min) (ACUTE ONLY) 35 min   RN requesting pt to ambulate, pt agreeable. Requested MS to massage out a knot he had in his lower calf before ambulating, assisted pt with this. HR up to 124 bpm w/ activity. No c/o when asked. Requested to sit in chair at EOS. Pt left in chair with all needs met, call bell in reach.   Janit Meline Mobility Specialist Please contact via SecureChat or [employer] [phone]

## 2023-05-18 NOTE — Progress Notes (Addendum)
 Advanced Heart Failure Rounding Note  Cardiologist: Cody Das, MD  Chief Complaint: Severe MR/HF Subjective:   Yesterday milrinone started and diuresed with IV lasix  + metolazone. Hydralazine added for afterload.   Denies SOB. Complaining leg cramps.    Objective:   Weight Range: 88.4 kg Body mass index is 30.51 kg/m.   Vital Signs:   Temp:  [97.6 F (36.4 C)-98.6 F (37 C)] 98.3 F (36.8 C) (04/30 0731) Pulse Rate:  [104-118] 118 (04/30 0731) Resp:  [17-28] 18 (04/30 0731) BP: (93-120)/(57-78) 116/57 (04/30 0731) SpO2:  [91 %-95 %] 91 % (04/30 0731) Weight:  [88.4 kg] 88.4 kg (04/30 0306) Last BM Date : 05/17/23  Weight change: Filed Weights   05/16/23 0916 05/17/23 0849 05/18/23 0306  Weight: 98.7 kg 93.5 kg 88.4 kg    Intake/Output:   Intake/Output Summary (Last 24 hours) at 05/18/2023 0853 Last data filed at 05/18/2023 0634 Gross per 24 hour  Intake 1342.26 ml  Output 5595 ml  Net -4252.74 ml      Physical Exam    General:   No resp difficulty Neck: supple. JVP 8-9  Cor: PMI nondisplaced. Tachy Regular rate & rhythm. No rubs, gallops. 3/6  or murmurs. Lungs: clear Abdomen: soft, nontender, nondistended.  Extremities: no cyanosis, clubbing, rash, trace edema Neuro: alert & oriented x3    Telemetry   ST 100s   EKG    N/A  Labs    CBC Recent Labs    05/16/23 1244 05/16/23 1247  HGB 14.6  14.3 15.0  HCT 43.0  42.0 44.0   Basic Metabolic Panel Recent Labs    45/40/98 1247 05/18/23 0253  NA 140 134*  K 4.0 3.2*  CL  --  92*  CO2  --  28  GLUCOSE  --  107*  BUN  --  18  CREATININE  --  1.20  CALCIUM  --  9.5   Liver Function Tests No results for input(s): "AST", "ALT", "ALKPHOS", "BILITOT", "PROT", "ALBUMIN" in the last 72 hours. No results for input(s): "LIPASE", "AMYLASE" in the last 72 hours. Cardiac Enzymes No results for input(s): "CKTOTAL", "CKMB", "CKMBINDEX", "TROPONINI" in the last 72  hours.  BNP: BNP (last 3 results) Recent Labs    04/18/23 1618  BNP 566.1*    ProBNP (last 3 results) No results for input(s): "PROBNP" in the last 8760 hours.   D-Dimer No results for input(s): "DDIMER" in the last 72 hours. Hemoglobin A1C No results for input(s): "HGBA1C" in the last 72 hours. Fasting Lipid Panel No results for input(s): "CHOL", "HDL", "LDLCALC", "TRIG", "CHOLHDL", "LDLDIRECT" in the last 72 hours. Thyroid Function Tests No results for input(s): "TSH", "T4TOTAL", "T3FREE", "THYROIDAB" in the last 72 hours.  Invalid input(s): "FREET3"  Other results:   Imaging    VAS US  CAROTID Result Date: 05/17/2023 Carotid Arterial Duplex Study Patient Name:  Calvin Drake  Date of Exam:   05/17/2023 Medical Rec #: [id number]       Accession #:    [id number] Date of Birth: 1960-11-05       Patient Gender: M Patient Age:   63 years Exam Location:  [facility] Procedure:      VAS US  CAROTID Referring Phys: Calvin Drake --------------------------------------------------------------------------------  Indications:  Pre-op. Risk Factors: None. Performing Technologist: Estanislao Heimlich  Examination Guidelines: A complete evaluation includes B-mode imaging, spectral Doppler, color Doppler, and power Doppler as needed of all accessible portions of each vessel. Bilateral testing is considered  an integral part of a complete examination. Limited examinations for reoccurring indications may be performed as noted.  Right Carotid Findings: +----------+--------+--------+--------+------------------+------------------+           PSV cm/sEDV cm/sStenosisPlaque DescriptionComments           +----------+--------+--------+--------+------------------+------------------+ CCA Prox  75      11                                                   +----------+--------+--------+--------+------------------+------------------+ CCA Distal62      15                                 intimal thickening +----------+--------+--------+--------+------------------+------------------+ ICA Prox  45      13                                intimal thickening +----------+--------+--------+--------+------------------+------------------+ ICA Distal51      19                                                   +----------+--------+--------+--------+------------------+------------------+ ECA       74      8                                                    +----------+--------+--------+--------+------------------+------------------+ +----------+--------+-------+--------+-------------------+           PSV cm/sEDV cmsDescribeArm Pressure (mmHG) +----------+--------+-------+--------+-------------------+ [id number]                                         +----------+--------+-------+--------+-------------------+ +---------+--------+--+--------+--+ VertebralPSV cm/s45EDV cm/s11 +---------+--------+--+--------+--+  Left Carotid Findings: +----------+--------+--------+--------+------------------+------------------+           PSV cm/sEDV cm/sStenosisPlaque DescriptionComments           +----------+--------+--------+--------+------------------+------------------+ CCA Prox  100     14                                                   +----------+--------+--------+--------+------------------+------------------+ CCA Distal68      18                                                   +----------+--------+--------+--------+------------------+------------------+ ICA Prox  33      11                                intimal thickening +----------+--------+--------+--------+------------------+------------------+ ICA Distal39      13                                                   +----------+--------+--------+--------+------------------+------------------+  ECA       99      12                                                    +----------+--------+--------+--------+------------------+------------------+ +----------+--------+--------+--------+-------------------+           PSV cm/sEDV cm/sDescribeArm Pressure (mmHG) +----------+--------+--------+--------+-------------------+ [id number]                                         +----------+--------+--------+--------+-------------------+ +---------+--------+--+--------+--+ VertebralPSV cm/s43EDV cm/s12 +---------+--------+--+--------+--+   Summary: Right Carotid: The extracranial vessels were near-normal with only minimal wall                thickening or plaque. Left Carotid: The extracranial vessels were near-normal with only minimal wall               thickening or plaque. Vertebrals:  Bilateral vertebral arteries demonstrate antegrade flow. Subclavians: Normal flow hemodynamics were seen in bilateral subclavian              arteries. *See table(s) above for measurements and observations.     Preliminary      Medications:     Scheduled Medications:  aspirin EC  81 mg Oral Daily   furosemide   80 mg Intravenous BID   hydrALAZINE  10 mg Oral Q8H   melatonin  3 mg Oral QHS   metolazone  2.5 mg Oral Daily   potassium chloride  40 mEq Oral BID   rosuvastatin  40 mg Oral Daily   sodium chloride flush  3 mL Intravenous Q12H    Infusions:  milrinone 0.25 mcg/kg/min (05/18/23 0634)    PRN Medications: acetaminophen, ondansetron (ZOFRAN) IV, sodium chloride flush    Patient Profile   Admitted with severe MR for work up.    Advanced Heart Team consulted for optimization prior to surgery.    Assessment/Plan   1. 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 RV severely reduced MV Severe MR RHC with elevated filling pressures, PA sat 50%, CO 3 and CI 1.4.  Continue milrinone 0.25 mcg.  Volume status improving. Brisk diuresis noted. Overall weight down 23 pounds.  -Continue IV lasix  today. Already had metolazone today will d/c.  - K 3.2 . Add Kdur 40  meq twice a day.  - Continue hydralazine 10 mg three times a day.  - Add 12.5 mg spironolactone daily - RHC on Friday.  Continue daily weight strict I/Os.     2. Mitral Regurgitation Severe MR. Ruptured Chordae Flail P3 and P2 CT surgery following with plan for MV repair. Needs additional optimization.   O2 sats 88-89% at rest. Adding incentive spirometer.    3. CAD No chest pain.  Cath nonobstructive CAD Continue aspirin + statin.   Ambulate today.   Length of Stay: 2  Nieves Bars, NP  05/18/2023, 8:53 AM  Advanced Heart Failure Team Pager [phone] (M-F; 7a - 5p)  Please contact [facility] for night-coverage after hours (5p -7a ) and weekends on [email or web address]

## 2023-05-18 NOTE — Progress Notes (Addendum)
   Called by nursing staff for SBP < 100.  Diuresing with IV lasix .   He reports multiple trips to the bathroom.   I assess patient. Denies dizziness. Denies SOB. Complaining of fatigue.   Change hold parameters for hydralazine. Check BMET and lactic acid now.    Calina Patrie NP-C  2:37 PM

## 2023-05-18 NOTE — H&P (View-Only) (Signed)
 Mobility Specialist Progress Note;   05/18/23 1050  Mobility  Activity Ambulated independently in hallway  Level of Assistance Standby assist, set-up cues, supervision of patient - no hands on  Assistive Device None  Distance Ambulated (ft) 400 ft  Range of Motion/Exercises Active Assistive  Activity Response Tolerated well  Mobility Referral Yes  Mobility visit 1 Mobility  Mobility Specialist Start Time (ACUTE ONLY) 1050  Mobility Specialist Stop Time (ACUTE ONLY) 1125  Mobility Specialist Time Calculation (min) (ACUTE ONLY) 35 min   RN requesting pt to ambulate, pt agreeable. Requested MS to massage out a knot he had in his lower calf before ambulating, assisted pt with this. HR up to 124 bpm w/ activity. No c/o when asked. Requested to sit in chair at EOS. Pt left in chair with all needs met, call bell in reach.   Janit Meline Mobility Specialist Please contact via SecureChat or [employer] [phone]

## 2023-05-18 NOTE — Plan of Care (Signed)

## 2023-05-19 ENCOUNTER — Ambulatory Visit: Payer: Self-pay | Admitting: Cardiology

## 2023-05-19 LAB — CBC
HCT: 48.8 % (ref 39.0–52.0)
Hemoglobin: 16.8 g/dL (ref 13.0–17.0)
MCH: 29 pg (ref 26.0–34.0)
MCHC: 34.4 g/dL (ref 30.0–36.0)
MCV: 84.1 fL (ref 80.0–100.0)
Platelets: 198 10*3/uL (ref 150–400)
RBC: 5.8 MIL/uL (ref 4.22–5.81)
RDW: 13.2 % (ref 11.5–15.5)
WBC: 11.6 10*3/uL — ABNORMAL HIGH (ref 4.0–10.5)
nRBC: 0 % (ref 0.0–0.2)

## 2023-05-19 LAB — BASIC METABOLIC PANEL WITH GFR
Anion gap: 14 (ref 5–15)
BUN: 25 mg/dL — ABNORMAL HIGH (ref 8–23)
CO2: 30 mmol/L (ref 22–32)
Calcium: 10.2 mg/dL (ref 8.9–10.3)
Chloride: 86 mmol/L — ABNORMAL LOW (ref 98–111)
Creatinine, Ser: 1.31 mg/dL — ABNORMAL HIGH (ref 0.61–1.24)
GFR, Estimated: >60 mL/min (ref >60)
Glucose, Bld: 140 mg/dL — ABNORMAL HIGH (ref 70–99)
Potassium: 3.5 mmol/L (ref 3.5–5.1)
Sodium: 130 mmol/L — ABNORMAL LOW (ref 135–145)

## 2023-05-19 MED ORDER — POTASSIUM CHLORIDE CRYS ER 20 MEQ PO TBCR
40.0000 meq | EXTENDED_RELEASE_TABLET | Freq: Once | ORAL | Status: AC
  Administered 2023-05-19: 40 meq via ORAL
  Filled 2023-05-19: qty 2

## 2023-05-19 MED ORDER — ACETAZOLAMIDE 250 MG PO TABS
500.0000 mg | ORAL_TABLET | Freq: Once | ORAL | Status: AC
  Administered 2023-05-19: 500 mg via ORAL
  Filled 2023-05-19: qty 2

## 2023-05-19 MED ORDER — ASPIRIN 81 MG PO CHEW
81.0000 mg | CHEWABLE_TABLET | ORAL | Status: AC
  Administered 2023-05-20: 81 mg via ORAL
  Filled 2023-05-19: qty 1

## 2023-05-19 MED ORDER — SODIUM CHLORIDE 0.9 % IV SOLN: INTRAVENOUS | Status: DC

## 2023-05-19 NOTE — Progress Notes (Signed)
 Nurse requested Mobility Specialist to perform oxygen saturation test with pt which includes removing pt from oxygen both at rest and while ambulating.  Below are the results from that testing.     Patient Saturations on Room Air at Rest = spO2 97%  Patient Saturations on Room Air while Ambulating = sp02 85% .    Patient Saturations on 2 Liters of oxygen while Ambulating = sp02 92%  At end of testing pt left in room on 4  Liters of oxygen.  Reported results to nurse.    Janit Meline Mobility Specialist Please contact via SecureChat or [employer] [phone]

## 2023-05-19 NOTE — Progress Notes (Signed)
 Pt received OHS book, Move in the Tube sheet, OHS careguide, and incentive spirometer (IS). Pt was educated on approx length of surgery and stay, importance of ambulation and using IS, restrictions, home needs, and CRPII.   Pt and family denies questions. I demonstrated sit-to-stands and asked pt to pratcie whil he waits for surgery.   Calvin Drake 05/19/2023 9:25 AM

## 2023-05-19 NOTE — Plan of Care (Signed)

## 2023-05-19 NOTE — Progress Notes (Signed)
 Pt was reeducated on how to use incentive spirometer. Pt use IS 10 times in one hour with RN present. Pt states he will continue to use.

## 2023-05-19 NOTE — Progress Notes (Signed)
 Mobility Specialist Progress Note;   05/19/23 1202  Mobility  Activity Ambulated with assistance in hallway  Level of Assistance Standby assist, set-up cues, supervision of patient - no hands on  Assistive Device None  Distance Ambulated (ft) 400 ft  Activity Response Tolerated well  Mobility Referral Yes  Mobility visit 1 Mobility  Mobility Specialist Start Time (ACUTE ONLY) 1202  Mobility Specialist Stop Time (ACUTE ONLY) 1217  Mobility Specialist Time Calculation (min) (ACUTE ONLY) 15 min   Pt agreeable to mobility. Required no physical assistance during ambulation, SV. Attempted ambulation on RA, however SPO2 desat to 85%, requiring 2LO2 to stay [facility]. No c/o when asked. Pt returned back to sitting gon EOB with all needs met. Visitors present. RN notified.   Janit Meline Mobility Specialist Please contact via SecureChat or [employer] [phone]

## 2023-05-19 NOTE — Progress Notes (Signed)
 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   3.2L UOP last 24 hrs with 80 mg lasix  IV BID (now on 20 IV BID) + metolazone .   No dyspnea, orthopnea or PND. Ambulated halls yesterday. Reports diuretics wiped him out, feels better on lower dose today. Very little appetite.   Objective:   Weight Range: 89.1 kg Body mass index is 30.77 kg/m.   Vital Signs:   Temp:  [97.8 F (36.6 C)-99.3 F (37.4 C)] 98 F (36.7 C) (05/01 0740) Pulse Rate:  [115-123] 115 (05/01 0740) Resp:  [18-20] 20 (05/01 0740) BP: (95-110)/(60-78) 105/60 (05/01 0740) SpO2:  [91 %-94 %] 94 % (05/01 0740) Weight:  [89.1 kg] 89.1 kg (05/01 0500) Last BM Date : 05/17/23  Weight change: Filed Weights   05/17/23 0849 05/18/23 0306 05/19/23 0500  Weight: 93.5 kg 88.4 kg 89.1 kg    Intake/Output:   Intake/Output Summary (Last 24 hours) at 05/19/2023 0917 Last data filed at 05/19/2023 0529 Gross per 24 hour  Intake 332.17 ml  Output 2600 ml  Net -2267.83 ml      Physical Exam    General:  Chronically ill appearing Neck: Jvp 7-8 Cor: Regular rate & rhythm, tachy. + holosystolic murmur Lungs: clear Abdomen: soft, nontender, nondistended. Extremities: 1+ edema Neuro: alert & orientedx3. Affect pleasant   Telemetry   ST 110s, occasional PVCs  EKG    N/A  Labs    CBC Recent Labs    05/16/23 1247 05/19/23 0832  WBC  --  11.6*  HGB 15.0 16.8  HCT 44.0 48.8  MCV  --  84.1  PLT  --  198   Basic Metabolic Panel Recent Labs    84/13/24 0253 05/18/23 1504 05/19/23 0832  NA 134* 131* 130*  K 3.2* 3.7 3.5  CL 92* 88* 86*  CO2 28 29 30   GLUCOSE 107* 128* 140*  BUN 18 20 25*  CREATININE 1.20 1.34* 1.31*  CALCIUM  9.5 10.1 10.2  MG 2.0  --   --    Liver Function Tests No results for input(s):  "AST", "ALT", "ALKPHOS", "BILITOT", "PROT", "ALBUMIN" in the last 72 hours. No results for input(s): "LIPASE", "AMYLASE" in the last 72 hours. Cardiac Enzymes No results for input(s): "CKTOTAL", "CKMB", "CKMBINDEX", "TROPONINI" in the last 72 hours.  BNP: BNP (last 3 results) Recent Labs    04/18/23 1618  BNP 566.1*    ProBNP (last 3 results) No results for input(s): "PROBNP" in the last 8760 hours.   D-Dimer No results for input(s): "DDIMER" in the last 72 hours. Hemoglobin A1C No results for input(s): "HGBA1C" in the last 72 hours. Fasting Lipid Panel No results for input(s): "CHOL", "HDL", "LDLCALC", "TRIG", "CHOLHDL", "LDLDIRECT" in the last 72 hours. Thyroid Function Tests No results for input(s): "TSH", "T4TOTAL", "T3FREE", "THYROIDAB" in the last 72 hours.  Invalid input(s): "FREET3"  Other results:   Imaging    No results found.    Medications:     Scheduled Medications:  aspirin  EC  81 mg Oral Daily   cyclobenzaprine   5 mg Oral QHS   furosemide   20 mg Intravenous BID   hydrALAZINE   10 mg Oral Q8H   melatonin  3 mg Oral QHS   potassium chloride   40 mEq Oral BID   rosuvastatin   40 mg Oral Daily   sodium chloride  flush  3 mL Intravenous Q12H   spironolactone   25 mg Oral Daily    Infusions:  milrinone  0.25 mcg/kg/min (05/19/23 0551)    PRN Medications: acetaminophen , ondansetron  (ZOFRAN ) IV, sodium chloride  flush    Patient Profile   63 y.o. male with history of severe MR. Admitted for workup/management after TEE and R/LHC.   Advanced Heart Team consulted for optimization prior to surgery.    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 RV severely reduced. 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   -RHC with elevated filling pressures, mPAP 51 mmHg, Fick CO 3 and CI 1.4.  -Continue milrinone  0.25 mcg.  -Volume status improving. Feels better after diuretics cut back, now on 20 mg IV lasix  BID. Cl trending down and CO2 trending up, will give 1 dose diamox  today - K 3.5. Supp. - Continue hydralazine  10 mg three times a day.  - Increase spiro to 25 mg daily - RHC tomorrow   2. Mitral Regurgitation -Severe MR. Ruptured chordae with flail P3 and P2 -CT surgery following with plan for MV repair on 05/05   3. CAD -Cath with nonobstructive CAD -Continue aspirin  + statin.    Length of Stay: 3  Tanysha Quant N, PA-C  05/19/2023, 9:17 AM  Advanced Heart Failure Team Pager [phone] (M-F; 7a - 5p)  Please contact [facility] for night-coverage after hours (5p -7a ) and weekends on [email or web address]

## 2023-05-20 ENCOUNTER — Inpatient Hospital Stay (HOSPITAL_COMMUNITY): Payer: Self-pay

## 2023-05-20 ENCOUNTER — Encounter (HOSPITAL_COMMUNITY)
Admission: RE | Disposition: A | Discharge status: Home / Self Care | Payer: Self-pay | Source: Home / Self Care | Attending: Thoracic Surgery (Cardiothoracic Vascular Surgery)

## 2023-05-20 DIAGNOSIS — I5033 Acute on chronic diastolic (congestive) heart failure: Secondary | ICD-10-CM

## 2023-05-20 HISTORY — PX: RIGHT HEART CATH: CATH118263

## 2023-05-20 LAB — POCT I-STAT EG7
Acid-Base Excess: 5 mmol/L — ABNORMAL HIGH (ref 0.0–2.0)
Bicarbonate: 30.8 mmol/L — ABNORMAL HIGH (ref 20.0–28.0)
Calcium, Ion: 1.26 mmol/L (ref 1.15–1.40)
HCT: 55 % — ABNORMAL HIGH (ref 39.0–52.0)
Hemoglobin: 18.7 g/dL — ABNORMAL HIGH (ref 13.0–17.0)
O2 Saturation: 68 %
Potassium: 3.7 mmol/L (ref 3.5–5.1)
Sodium: 132 mmol/L — ABNORMAL LOW (ref 135–145)
TCO2: 32 mmol/L (ref 22–32)
pCO2, Ven: 46.9 mmHg (ref 44–60)
pH, Ven: 7.425 (ref 7.25–7.43)
pO2, Ven: 35 mmHg (ref 32–45)

## 2023-05-20 LAB — ECHOCARDIOGRAM LIMITED
Area-P 1/2: 5.09 cm2
Calc EF: 64.6 %
Height: 67 in
Single Plane A2C EF: 62.5 %
Single Plane A4C EF: 65.1 %
Weight: 3142.88 [oz_av]

## 2023-05-20 LAB — BASIC METABOLIC PANEL WITH GFR
Anion gap: 13 (ref 5–15)
BUN: 32 mg/dL — ABNORMAL HIGH (ref 8–23)
CO2: 25 mmol/L (ref 22–32)
Calcium: 10.1 mg/dL (ref 8.9–10.3)
Chloride: 89 mmol/L — ABNORMAL LOW (ref 98–111)
Creatinine, Ser: 1.18 mg/dL (ref 0.61–1.24)
GFR, Estimated: >60 mL/min (ref >60)
Glucose, Bld: 183 mg/dL — ABNORMAL HIGH (ref 70–99)
Potassium: 3.8 mmol/L (ref 3.5–5.1)
Sodium: 127 mmol/L — ABNORMAL LOW (ref 135–145)

## 2023-05-20 SURGERY — RIGHT HEART CATH
Anesthesia: LOCAL

## 2023-05-20 MED ORDER — HEPARIN 30,000 UNITS/1000 ML (OHS) CELLSAVER SOLUTION
Status: DC
  Filled 2023-05-20: qty 1000

## 2023-05-20 MED ORDER — VANCOMYCIN HCL 1.5 G IV SOLR
1500.0000 mg | INTRAVENOUS | Status: AC
  Administered 2023-05-23: 1500 mg via INTRAVENOUS
  Filled 2023-05-20: qty 30

## 2023-05-20 MED ORDER — PLASMA-LYTE A IV SOLN
INTRAVENOUS | Status: DC
  Filled 2023-05-20: qty 2.5

## 2023-05-20 MED ORDER — DEXMEDETOMIDINE HCL IN NACL 400 MCG/100ML IV SOLN
0.1000 ug/kg/h | INTRAVENOUS | Status: AC
  Administered 2023-05-23: .2 ug/kg/h via INTRAVENOUS
  Filled 2023-05-20: qty 100

## 2023-05-20 MED ORDER — FUROSEMIDE 40 MG PO TABS
40.0000 mg | ORAL_TABLET | Freq: Every day | ORAL | Status: DC
  Administered 2023-05-21 – 2023-05-22 (×2): 40 mg via ORAL
  Filled 2023-05-20 (×2): qty 1

## 2023-05-20 MED ORDER — POTASSIUM CHLORIDE 2 MEQ/ML IV SOLN
80.0000 meq | INTRAVENOUS | Status: DC
  Filled 2023-05-20: qty 40

## 2023-05-20 MED ORDER — NOREPINEPHRINE 4 MG/250ML-% IV SOLN
0.0000 ug/min | INTRAVENOUS | Status: DC
  Filled 2023-05-20: qty 250

## 2023-05-20 MED ORDER — EPINEPHRINE HCL 5 MG/250ML IV SOLN IN NS
0.0000 ug/min | INTRAVENOUS | Status: AC
  Administered 2023-05-23: 4 ug/min via INTRAVENOUS
  Filled 2023-05-20: qty 250

## 2023-05-20 MED ORDER — FUROSEMIDE 40 MG PO TABS: 40.0000 mg | ORAL_TABLET | Freq: Every day | ORAL | Status: DC

## 2023-05-20 MED ORDER — TRANEXAMIC ACID (OHS) PUMP PRIME SOLUTION
2.0000 mg/kg | INTRAVENOUS | Status: DC
  Filled 2023-05-20: qty 1.78

## 2023-05-20 MED ORDER — PHENYLEPHRINE HCL-NACL 20-0.9 MG/250ML-% IV SOLN
30.0000 ug/min | INTRAVENOUS | Status: AC
  Administered 2023-05-23: 40 ug/min via INTRAVENOUS
  Filled 2023-05-20: qty 250

## 2023-05-20 MED ORDER — LIDOCAINE HCL (PF) 1 % IJ SOLN
INTRAMUSCULAR | Status: DC | PRN
  Administered 2023-05-20: 2 mL

## 2023-05-20 MED ORDER — TRANEXAMIC ACID 1000 MG/10ML IV SOLN
1.5000 mg/kg/h | INTRAVENOUS | Status: AC
  Administered 2023-05-23: 1.5 mg/kg/h via INTRAVENOUS
  Filled 2023-05-20: qty 25

## 2023-05-20 MED ORDER — HEPARIN (PORCINE) IN NACL 2000-0.9 UNIT/L-% IV SOLN
INTRAVENOUS | Status: DC | PRN
  Administered 2023-05-20: 1000 mL

## 2023-05-20 MED ORDER — MANNITOL 20 % IV SOLN
INTRAVENOUS | Status: DC
  Filled 2023-05-20: qty 13

## 2023-05-20 MED ORDER — CEFAZOLIN SODIUM-DEXTROSE 2-4 GM/100ML-% IV SOLN
2.0000 g | INTRAVENOUS | Status: DC
  Filled 2023-05-20 (×2): qty 100

## 2023-05-20 MED ORDER — CEFAZOLIN SODIUM-DEXTROSE 2-4 GM/100ML-% IV SOLN
2.0000 g | INTRAVENOUS | Status: AC
  Administered 2023-05-23 (×2): 2 g via INTRAVENOUS
  Filled 2023-05-20: qty 100

## 2023-05-20 MED ORDER — LIDOCAINE HCL (PF) 1 % IJ SOLN
INTRAMUSCULAR | Status: AC
  Filled 2023-05-20: qty 30

## 2023-05-20 MED ORDER — INSULIN REGULAR(HUMAN) IN NACL 100-0.9 UT/100ML-% IV SOLN
INTRAVENOUS | Status: AC
  Administered 2023-05-23: 3.2 [IU]/h via INTRAVENOUS
  Filled 2023-05-20: qty 100

## 2023-05-20 MED ORDER — TRANEXAMIC ACID (OHS) BOLUS VIA INFUSION
15.0000 mg/kg | INTRAVENOUS | Status: AC
  Administered 2023-05-23: 1336.5 mg via INTRAVENOUS
  Filled 2023-05-20: qty 1337

## 2023-05-20 MED ORDER — MILRINONE LACTATE IN DEXTROSE 20-5 MG/100ML-% IV SOLN
0.3000 ug/kg/min | INTRAVENOUS | Status: DC
  Filled 2023-05-20 (×2): qty 100

## 2023-05-20 MED ORDER — POTASSIUM CHLORIDE CRYS ER 20 MEQ PO TBCR
40.0000 meq | EXTENDED_RELEASE_TABLET | Freq: Once | ORAL | Status: DC
  Filled 2023-05-20: qty 2

## 2023-05-20 MED ORDER — NITROGLYCERIN IN D5W 200-5 MCG/ML-% IV SOLN
2.0000 ug/min | INTRAVENOUS | Status: DC
  Filled 2023-05-20: qty 250

## 2023-05-20 SURGICAL SUPPLY — 5 items
CATH SWAN GANZ 7F STRAIGHT (CATHETERS) IMPLANT
GLIDESHEATH SLENDER 7FR .021G (SHEATH) IMPLANT
PACK CARDIAC CATHETERIZATION (CUSTOM PROCEDURE TRAY) ×1 IMPLANT
TRANSDUCER W/STOPCOCK (MISCELLANEOUS) IMPLANT
TUBING ART PRESS 72 MALE/FEM (TUBING) IMPLANT

## 2023-05-20 NOTE — Plan of Care (Signed)

## 2023-05-20 NOTE — TOC Progression Note (Signed)
 Transition of Care [facility]) - Progression Note    Patient Details  Name: Calvin Drake MRN: [id number] Date of Birth: 20-May-1960  Transition of Care [facility] CM/SW Contact  Ernst Heap Phone Number: [phone] 05/20/2023, 9:39 AM  Clinical Narrative:   Will continue to follow patient to monitor dc needs.   TOC will continue to follow.     Expected Discharge Plan: Home/Self Care Barriers to Discharge: Continued Medical Work up  Expected Discharge Plan and Services   Discharge Planning Services: CM Consult   Living arrangements for the past 2 months: Single Family Home                                       Social Determinants of Health (SDOH) Interventions SDOH Screenings   Food Insecurity: No Food Insecurity (05/17/2023)  Housing: Low Risk  (05/17/2023)  Transportation Needs: No Transportation Needs (05/17/2023)  Utilities: Not At Risk (05/17/2023)  Tobacco Use: Low Risk  (05/16/2023)    Readmission Risk Interventions     No data to display

## 2023-05-20 NOTE — Progress Notes (Signed)
*  PRELIMINARY RESULTS* Echocardiogram 2D Echocardiogram has been performed.  Calvin Drake 05/20/2023, 2:49 PM

## 2023-05-20 NOTE — Progress Notes (Signed)
*   Day of Surgery * Procedure(s) (LRB): RIGHT HEART CATH (N/A) Subjective: No complaints  Objective: Vital signs in last 24 hours: Temp:  [97.8 F (36.6 C)-98.7 F (37.1 C)] 98 F (36.7 C) (05/02 1110) Pulse Rate:  [0-125] 114 (05/02 1110) Cardiac Rhythm: Ventricular tachycardia (05/02 1023) Resp:  [16-28] 19 (05/02 1110) BP: (94-127)/(70-90) 109/79 (05/02 1328) SpO2:  [88 %-96 %] 92 % (05/02 1110)  Hemodynamic parameters for last 24 hours:    Intake/Output from previous day: 05/01 0701 - 05/02 0700 In: 405.8 [P.O.:240; I.V.:165.8] Out: 1200 [Urine:1200] Intake/Output this shift: Total I/O In: 142.5 [P.O.:120; I.V.:22.5] Out: 700 [Urine:700]  General appearance: alert, cooperative, and no distress Neurologic: intact Heart: regular rate and rhythm and holosystolic murmur Lungs: diminished breath sounds bibasilar Extremities: edema improved  Lab Results: Recent Labs    05/19/23 0832 05/20/23 0924  WBC 11.6*  --   HGB 16.8 18.7*  HCT 48.8 55.0*  PLT 198  --    BMET:  Recent Labs    05/18/23 1504 05/19/23 0832 05/20/23 0924  NA 131* 130* 132*  K 3.7 3.5 3.7  CL 88* 86*  --   CO2 29 30  --   GLUCOSE 128* 140*  --   BUN 20 25*  --   CREATININE 1.34* 1.31*  --   CALCIUM  10.1 10.2  --     PT/INR: No results for input(s): "LABPROT", "INR" in the last 72 hours. ABG    Component Value Date/Time   PHART 7.431 05/16/2023 1244   HCO3 30.8 (H) 05/20/2023 0924   TCO2 32 05/20/2023 0924   ACIDBASEDEF 1.0 05/16/2023 1244   ACIDBASEDEF 1.0 05/16/2023 1244   O2SAT 68 05/20/2023 0924   CBG (last 3)  No results for input(s): "GLUCAP" in the last 72 hours.  Assessment/Plan: S/P Procedure(s) (LRB): RIGHT HEART CATH (N/A) - Severe MR secondary to ruptured chordae with flail P3 and involving P2 with class IV CHF Right heart cath today showed marked improvement with normal PA pressures and CI 2.4 post diuresis For surgery Monday AM, all questions answered   LOS:  4 days    Calvin Drake 05/20/2023

## 2023-05-20 NOTE — Progress Notes (Signed)
 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 No complaints today.  - RHC today with significant improvement in filling pressures.   Objective:   Weight Range: 89.1 kg Body mass index is 30.77 kg/m.   Vital Signs:   Temp:  [97.3 F (36.3 C)-98.7 F (37.1 C)] 98.2 F (36.8 C) (05/02 0744) Pulse Rate:  [0-125] 0 (05/02 0936) Resp:  [16-28] 23 (05/02 0928) BP: (94-127)/(58-90) 114/86 (05/02 0933) SpO2:  [88 %-96 %] 91 % (05/02 0928) Last BM Date : 05/17/23  Weight change: Filed Weights   05/17/23 0849 05/18/23 0306 05/19/23 0500  Weight: 93.5 kg 88.4 kg 89.1 kg    Intake/Output:   Intake/Output Summary (Last 24 hours) at 05/20/2023 1029 Last data filed at 05/20/2023 0458 Gross per 24 hour  Intake 405.82 ml  Output 1200 ml  Net -794.18 ml      Physical Exam    General:  Chronically ill appearing Neck: JVP 5 Cor: Regular rate & rhythm, tachy. +holosystolic murmur.  Lungs: CTA Abdomen: soft, nontender, nondistended. Extremities: No edema Neuro: alert & orientedx3. Affect pleasant   Telemetry   ST 110s, occasional PVCs  EKG    N/A  Labs    CBC Recent Labs    05/19/23 0832  WBC 11.6*  HGB 16.8  HCT 48.8  MCV 84.1  PLT 198   Basic Metabolic Panel Recent Labs    84/13/24 0253 05/18/23 1504 05/19/23 0832  NA 134* 131* 130*  K 3.2* 3.7 3.5  CL 92* 88* 86*  CO2 28 29 30   GLUCOSE 107* 128* 140*  BUN 18 20 25*  CREATININE 1.20 1.34* 1.31*  CALCIUM  9.5 10.1 10.2  MG 2.0  --   --    Liver Function Tests No results for input(s): "AST", "ALT", "ALKPHOS", "BILITOT", "PROT", "ALBUMIN" in the last 72 hours. No results for input(s): "LIPASE", "AMYLASE" in the last 72 hours. Cardiac Enzymes No results for input(s): "CKTOTAL", "CKMB", "CKMBINDEX",  "TROPONINI" in the last 72 hours.  BNP: BNP (last 3 results) Recent Labs    04/18/23 1618  BNP 566.1*     Other results:   Imaging    CARDIAC CATHETERIZATION Result Date: 05/20/2023 HEMODYNAMICS: On milrinone  0.66mcg/kg/min RA:   1 mmHg (mean) RV:   32/1-3 mmHg PA:   31/18 mmHg (24 mean) PCWP:  14 mmHg (mean)    Estimated Fick CO/CI   4.9 L/min, 2.4 L/min/m2 Thermodilution CO/CI   2.4 L/min, 1.7 L/min/m2    TPG    10  mmHg     PVR     2-4.2 Wood Units PAPi      >5  IMPRESSION: Normal filling pressures Normal PA mean Low normal cardiac index by Fick; severely reduced by TD Kunio Cummiskey 9:39 AM      Medications:     Scheduled Medications:  aspirin  EC  81 mg Oral Daily   cyclobenzaprine   5 mg Oral QHS   furosemide   20 mg Intravenous BID   hydrALAZINE   10 mg Oral Q8H   melatonin  3 mg Oral QHS   potassium chloride   40 mEq Oral BID   rosuvastatin   40 mg Oral Daily   sodium chloride  flush  3 mL Intravenous Q12H   spironolactone   25 mg Oral Daily  Infusions:  milrinone  0.25 mcg/kg/min (05/20/23 0831)    PRN Medications: acetaminophen , ondansetron  (ZOFRAN ) IV, sodium chloride  flush    Patient Profile   63 y.o. male with history of severe MR. Admitted for workup/management after TEE and R/LHC.   Advanced Heart Team consulted for optimization prior to surgery.    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 RV severely reduced. 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  -Repeat RHC today with low filling pressures; RA 1, PCWP 14-16. Cardiac index remains reduced by TD despite addition of milrinone  0.25mcg/kg/min. Will repeat limited TTE to re-assess RV function.  -Discussed case with CTS, will plan to move forward with MVR  on 05/23/23.  -D/C IV lasix ; start lasix  40mg  daily. Need to maintain net even I/Os.  -sCr stable at 1.31 today; BUN slightly higher at 25.    2. Mitral Regurgitation -Severe MR. Ruptured chordae with flail P3 and P2 -CT surgery following with plan for MV repair on 05/05 - see above; no complaints.    3. CAD -Cath with nonobstructive CAD -Continue aspirin  + statin.  - no chest pain; doing well.    Length of Stay: 4  Earsie Humm, DO  05/20/2023, 10:29 AM  Advanced Heart Failure Team Pager [phone] (M-F; 7a - 5p)  Please contact [facility] for night-coverage after hours (5p -7a ) and weekends on [email or web address]

## 2023-05-20 NOTE — Interval H&P Note (Signed)
 History and Physical Interval Note:  05/20/2023 9:10 AM  Calvin Drake  has presented today for surgery, with the diagnosis of heart failure.  The various methods of treatment have been discussed with the patient and family. After consideration of risks, benefits and other options for treatment, the patient has consented to  Procedure(s): RIGHT HEART CATH (N/A) as a surgical intervention.  The patient's history has been reviewed, patient examined, no change in status, stable for surgery.  I have reviewed the patient's chart and labs.  Questions were answered to the patient's satisfaction.     Melek Pownall

## 2023-05-21 ENCOUNTER — Inpatient Hospital Stay (HOSPITAL_COMMUNITY): Payer: Self-pay

## 2023-05-21 ENCOUNTER — Encounter (HOSPITAL_COMMUNITY): Payer: Self-pay | Admitting: Cardiology

## 2023-05-21 LAB — BASIC METABOLIC PANEL WITH GFR
Anion gap: 15 (ref 5–15)
BUN: 35 mg/dL — ABNORMAL HIGH (ref 8–23)
CO2: 23 mmol/L (ref 22–32)
Calcium: 10.2 mg/dL (ref 8.9–10.3)
Chloride: 92 mmol/L — ABNORMAL LOW (ref 98–111)
Creatinine, Ser: 1.12 mg/dL (ref 0.61–1.24)
GFR, Estimated: >60 mL/min (ref >60)
Glucose, Bld: 123 mg/dL — ABNORMAL HIGH (ref 70–99)
Potassium: 3.6 mmol/L (ref 3.5–5.1)
Sodium: 130 mmol/L — ABNORMAL LOW (ref 135–145)

## 2023-05-21 NOTE — Progress Notes (Addendum)
 Cardiologist:  Patwardhan  Subjective:  Denies SSCP, palpitations or Dyspnea Tachycardic on Telemetry   Objective:  Vitals:   05/21/23 0323 05/21/23 0650 05/21/23 0728 05/21/23 1118  BP: 124/81  118/86 123/88  Pulse: (!) 115  (!) 114 (!) 119  Resp: 20  16 20   Temp: 98.3 F (36.8 C)  97.8 F (36.6 C) 98.6 F (37 C)  TempSrc: Oral  Oral Oral  SpO2: 91%  92% 94%  Weight:  83.4 kg    Height:        Intake/Output from previous day:  Intake/Output Summary (Last 24 hours) at 05/21/2023 1326 Last data filed at 05/21/2023 1308 Gross per 24 hour  Intake 385.54 ml  Output 1800 ml  Net -1414.46 ml    Physical Exam: No distress Lungs clear Loud apical MR murmur radiates anteriorly Abdomen benign No edema Sats 95%   Lab Results: Basic Metabolic Panel: Recent Labs    05/20/23 1448 05/21/23 0916  NA 127* 130*  K 3.8 3.6  CL 89* 92*  CO2 25 23  GLUCOSE 183* 123*  BUN 32* 35*  CREATININE 1.18 1.12  CALCIUM  10.1 10.2    CBC: Recent Labs    05/19/23 0832 05/20/23 0924  WBC 11.6*  --   HGB 16.8 18.7*  HCT 48.8 55.0*  MCV 84.1  --   PLT 198  --      Imaging: DG Chest 2 View Result Date: 05/21/2023 CLINICAL DATA:  Congestive heart failure EXAM: CHEST - 2 VIEW COMPARISON:  None Available. FINDINGS: Lungs volumes are small, but are symmetric and are clear. No pneumothorax or pleural effusion. Cardiac size within normal limits. Pulmonary vascularity is normal. Osseous structures are age-appropriate. No acute bone abnormality. IMPRESSION: 1. Pulmonary hypoinflation. Electronically Signed   By: Worthy Heads M.D.   On: 05/21/2023 11:52   ECHOCARDIOGRAM LIMITED Result Date: 05/20/2023    ECHOCARDIOGRAM LIMITED REPORT   Patient Name:   Calvin Drake Date of Exam: 05/20/2023 Medical Rec #:  [id number]      Height:       67.0 in Accession #:    [id number]     Weight:       196.4 lb Date of Birth:  1960/06/07      BSA:          2.007 m Patient Age:    62 years       BP:            109/79 mmHg Patient Gender: M              HR:           117 bpm. Exam Location:  Inpatient Procedure: 2D Echo, Cardiac Doppler and Color Doppler (Both Spectral and Color            Flow Doppler were utilized during procedure). Indications:    I50.9* Heart failure (unspecified)  History:        Patient has prior history of Echocardiogram examinations, most                 recent 04/22/2023.  Sonographer:    Andrena Bang Referring Phys: [phone] AMY D CLEGG IMPRESSIONS  1. Left ventricular ejection fraction, by estimation, is 60 to 65%. The left ventricle has normal function. The left ventricle has no regional wall motion abnormalities.  2. The RV is not thoroughly visualized, however, right ventricular systolic function appears at least mildly reduced. The right ventricular size is mildly enlarged.  3.  The mitral valve is abnormal. Severe 4 mitral valve regurgitation secondary to flail posterior leaflet. No evidence of mitral stenosis.  4. The aortic valve is normal in structure. Aortic valve regurgitation is not visualized. No aortic stenosis is present.  5. The inferior vena cava is normal in size with greater than 50% respiratory variability, suggesting right atrial pressure of 3 mmHg. FINDINGS  Left Ventricle: Left ventricular ejection fraction, by estimation, is 60 to 65%. The left ventricle has normal function. The left ventricle has no regional wall motion abnormalities. The left ventricular internal cavity size was normal in size. There is  no left ventricular hypertrophy. Right Ventricle: The right ventricular size is mildly enlarged. No increase in right ventricular wall thickness. Right ventricular systolic function is mildly reduced. Left Atrium: Left atrial size was normal in size. Right Atrium: Right atrial size was normal in size. Pericardium: There is no evidence of pericardial effusion. Mitral Valve: The mitral valve is abnormal. Severe mitral valve regurgitation. No evidence of mitral valve stenosis.  Tricuspid Valve: The tricuspid valve is normal in structure. Tricuspid valve regurgitation is trivial. No evidence of tricuspid stenosis. Aortic Valve: The aortic valve is normal in structure. Aortic valve regurgitation is not visualized. No aortic stenosis is present. Pulmonic Valve: The pulmonic valve was normal in structure. Pulmonic valve regurgitation is mild. No evidence of pulmonic stenosis. Aorta: The aortic root is normal in size and structure. Venous: The inferior vena cava is normal in size with greater than 50% respiratory variability, suggesting right atrial pressure of 3 mmHg. IAS/Shunts: No atrial level shunt detected by color flow Doppler.  LV Volumes (MOD) LV vol d, MOD A2C: 130.0 ml Diastology LV vol d, MOD A4C: 169.0 ml LV e' medial:    8.21 cm/s LV vol s, MOD A2C: 48.7 ml  LV E/e' medial:  13.4 LV vol s, MOD A4C: 58.9 ml  LV e' lateral:   21.80 cm/s LV SV MOD A2C:     81.3 ml  LV E/e' lateral: 5.0 LV SV MOD A4C:     169.0 ml LV SV MOD BP:      98.3 ml MITRAL VALVE MV Area (PHT): 5.09 cm MV Decel Time: 149 msec MV E velocity: 110.00 cm/s MV A velocity: 64.00 cm/s MV E/A ratio:  1.72 Aditya Sabharwal Electronically signed by Alwin Baars Signature Date/Time: 05/20/2023/2:55:47 PM    Final    CARDIAC CATHETERIZATION Result Date: 05/20/2023 HEMODYNAMICS: On milrinone  0.38mcg/kg/min RA:   1 mmHg (mean) RV:   32/1-3 mmHg PA:   31/18 mmHg (24 mean) PCWP:  14 mmHg (mean)    Estimated Fick CO/CI   4.9 L/min, 2.4 L/min/m2 Thermodilution CO/CI   2.4 L/min, 1.7 L/min/m2    TPG    10  mmHg     PVR     2-4.2 Wood Units PAPi      >5  IMPRESSION: Normal filling pressures Normal PA mean Low normal cardiac index by Fick; severely reduced by TD Aditya Sabharwal 9:39 AM    Cardiac Studies:  ECG: ST rate 108    Telemetry: ? ST vs flutter rate 115  Echo: flail posterior leaflet with torrential MR EF 50-55%   Medications:    aspirin  EC  81 mg Oral Daily   cyclobenzaprine   5 mg Oral QHS   [START ON  05/23/2023] epinephrine   0-10 mcg/min Intravenous To OR   furosemide   40 mg Oral Daily   [START ON 05/23/2023] heparin  sodium (porcine) 2,500 Units, papaverine 30 mg  in electrolyte-A (PLASMALYTE-A PH 7.4) 500 mL irrigation   Irrigation To OR   hydrALAZINE   10 mg Oral Q8H   [START ON 05/23/2023] insulin    Intravenous To OR   [START ON 05/23/2023] [place] Blood Cardioplegia vial (lidocaine /magnesium /mannitol 0.26g-4g-6.4g)   Intracoronary To OR   melatonin  3 mg Oral QHS   [START ON 05/23/2023] phenylephrine   30-200 mcg/min Intravenous To OR   [START ON 05/23/2023] potassium chloride   80 mEq Other To OR   potassium chloride   40 mEq Oral Once   rosuvastatin   40 mg Oral Daily   sodium chloride  flush  3 mL Intravenous Q12H   spironolactone   25 mg Oral Daily   [START ON 05/23/2023] tranexamic acid   15 mg/kg Intravenous To OR   [START ON 05/23/2023] tranexamic acid   2 mg/kg Intracatheter To OR      [START ON 05/23/2023]  ceFAZolin  (ANCEF ) IV     [START ON 05/23/2023]  ceFAZolin  (ANCEF ) IV     [START ON 05/23/2023] dexmedetomidine      [START ON 05/23/2023] heparin  30,000 units/NS 1000 mL solution for CELLSAVER     milrinone  0.25 mcg/kg/min (05/21/23 1042)   [START ON 05/23/2023] milrinone      [START ON 05/23/2023] nitroGLYCERIN      [START ON 05/23/2023] norepinephrine      [START ON 05/23/2023] tranexamic acid  (CYKLOKAPRON ) 2,500 mg in sodium chloride  0.9 % 250 mL (10 mg/mL) infusion     [START ON 05/23/2023] vancomycin       Assessment/Plan:   MR: flail posterior leaflet Hopefully MVR with repair Dr Luna Salinas Monday On Aldactone  I/O's minus a liter. Stable will check ECG make sure sinus tach not flutter CI only 1.47 at cath defer beta blocker with low forward output and recent CHF. Check ECG consider adding amiodarone iv at risk for PAF Repeat Right heart cath 05/20/23 with PCWP 14 Euvolemic on exam CXR this am hypoinflation no CHF  Calvin Drake 05/21/2023, 1:26 PM

## 2023-05-21 NOTE — Progress Notes (Signed)
 Mobility Specialist Progress Note:   05/21/23 1517  Mobility  Activity Ambulated with assistance in hallway  Level of Assistance Contact guard assist, steadying assist  Assistive Device None  Distance Ambulated (ft) 400 ft  Activity Response Tolerated well  Mobility Referral Yes  Mobility visit 1 Mobility  Mobility Specialist Start Time (ACUTE ONLY) 1430  Mobility Specialist Stop Time (ACUTE ONLY) 1445  Mobility Specialist Time Calculation (min) (ACUTE ONLY) 15 min   Received pt in bed, on 4L/min having no complaints and agreeable to mobility. Pt was asymptomatic throughout session. Able to ambulate on RA w/o fault. Returned to room w/o fault. Left in bed w/ call bell in reach and all needs met. Left on 4L/min.   Pre Mobility 4L/min SPO2 95% During Mobility RA SPO2 90% Post Mobility 4L/min SPO2 93%  Inetta Manes Mobility Specialist  Please contact vis Secure Chat or  Rehab Office [phone]

## 2023-05-21 NOTE — Plan of Care (Signed)
  Problem: Education: Goal: Individualized Educational Video(s) Outcome: Progressing   Problem: Activity: Goal: Ability to return to baseline activity level will improve Outcome: Progressing   Problem: Cardiovascular: Goal: Ability to achieve and maintain adequate cardiovascular perfusion will improve Outcome: Progressing

## 2023-05-22 LAB — POCT I-STAT EG7
Acid-Base Excess: 4 mmol/L — ABNORMAL HIGH (ref 0.0–2.0)
Bicarbonate: 29.8 mmol/L — ABNORMAL HIGH (ref 20.0–28.0)
Calcium, Ion: 1.22 mmol/L (ref 1.15–1.40)
HCT: 55 % — ABNORMAL HIGH (ref 39.0–52.0)
Hemoglobin: 18.7 g/dL — ABNORMAL HIGH (ref 13.0–17.0)
O2 Saturation: 68 %
Potassium: 3.6 mmol/L (ref 3.5–5.1)
Sodium: 132 mmol/L — ABNORMAL LOW (ref 135–145)
TCO2: 31 mmol/L (ref 22–32)
pCO2, Ven: 45.9 mmHg (ref 44–60)
pH, Ven: 7.421 (ref 7.25–7.43)
pO2, Ven: 35 mmHg (ref 32–45)

## 2023-05-22 LAB — BLOOD GAS, ARTERIAL
Acid-Base Excess: 3.5 mmol/L — ABNORMAL HIGH (ref 0.0–2.0)
Bicarbonate: 26.7 mmol/L (ref 20.0–28.0)
Drawn by: 51185
O2 Saturation: 99.5 %
Patient temperature: 36.7
pCO2 arterial: 35 mmHg (ref 32–48)
pH, Arterial: 7.49 — ABNORMAL HIGH (ref 7.35–7.45)
pO2, Arterial: 120 mmHg — ABNORMAL HIGH (ref 83–108)

## 2023-05-22 LAB — BASIC METABOLIC PANEL WITH GFR
Anion gap: 15 (ref 5–15)
BUN: 34 mg/dL — ABNORMAL HIGH (ref 8–23)
CO2: 23 mmol/L (ref 22–32)
Calcium: 9.9 mg/dL (ref 8.9–10.3)
Chloride: 90 mmol/L — ABNORMAL LOW (ref 98–111)
Creatinine, Ser: 1.13 mg/dL (ref 0.61–1.24)
GFR, Estimated: >60 mL/min (ref >60)
Glucose, Bld: 146 mg/dL — ABNORMAL HIGH (ref 70–99)
Potassium: 3.5 mmol/L (ref 3.5–5.1)
Sodium: 128 mmol/L — ABNORMAL LOW (ref 135–145)

## 2023-05-22 LAB — HEMOGLOBIN A1C
Hgb A1c MFr Bld: 5.9 % — ABNORMAL HIGH (ref 4.8–5.6)
Mean Plasma Glucose: 122.63 mg/dL

## 2023-05-22 MED ORDER — CHLORHEXIDINE GLUCONATE CLOTH 2 % EX PADS
6.0000 | MEDICATED_PAD | Freq: Once | CUTANEOUS | Status: AC
  Administered 2023-05-22: 6 via TOPICAL

## 2023-05-22 MED ORDER — BISACODYL 5 MG PO TBEC
5.0000 mg | DELAYED_RELEASE_TABLET | Freq: Once | ORAL | Status: AC
  Administered 2023-05-22: 5 mg via ORAL
  Filled 2023-05-22: qty 1

## 2023-05-22 MED ORDER — METOPROLOL TARTRATE 12.5 MG HALF TABLET
12.5000 mg | ORAL_TABLET | Freq: Once | ORAL | Status: AC
  Administered 2023-05-23: 12.5 mg via ORAL
  Filled 2023-05-22: qty 1

## 2023-05-22 MED ORDER — CHLORHEXIDINE GLUCONATE CLOTH 2 % EX PADS
6.0000 | MEDICATED_PAD | Freq: Once | CUTANEOUS | Status: AC
  Administered 2023-05-23: 6 via TOPICAL

## 2023-05-22 MED ORDER — DIAZEPAM 5 MG PO TABS
5.0000 mg | ORAL_TABLET | Freq: Once | ORAL | Status: AC
  Administered 2023-05-23: 5 mg via ORAL
  Filled 2023-05-22: qty 1

## 2023-05-22 MED ORDER — CHLORHEXIDINE GLUCONATE 0.12 % MT SOLN
15.0000 mL | Freq: Once | OROMUCOSAL | Status: AC
  Administered 2023-05-23: 15 mL via OROMUCOSAL
  Filled 2023-05-22: qty 15

## 2023-05-22 NOTE — Anesthesia Preprocedure Evaluation (Addendum)
 Anesthesia Evaluation  Patient identified by MRN, date of birth, ID band Patient awake    Reviewed: Allergy & Precautions, H&P , NPO status , Patient's Chart, lab work & pertinent test results  Airway Mallampati: II  TM Distance: >3 FB Neck ROM: Full    Dental no notable dental hx.    Pulmonary neg pulmonary ROS, neg COPD   Pulmonary exam normal breath sounds clear to auscultation       Cardiovascular +CHF, + Orthopnea and + PND  Normal cardiovascular exam+ Valvular Problems/Murmurs MR  Rhythm:Regular Rate:Normal  IMPRESSIONS     1. Left ventricular ejection fraction, by estimation, is 50 to 55%. The  left ventricle has low normal function. There is the interventricular  septum is flattened in systole and diastole, consistent with right  ventricular pressure and volume overload.   2. Right ventricular systolic function is severely reduced. The right  ventricular size is moderately enlarged. There is normal pulmonary artery  systolic pressure. The estimated right ventricular systolic pressure is  35.8 mmHg.   3. Left atrial size was severely dilated. No left atrial/left atrial  appendage thrombus was detected.   4. Right atrial size was severely dilated.   5. 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 The flail gap is > 8  mm. The flail width is 2 cm. The   posterior leaflet is broad (16 mm). The mitral valve area by planimetry  is > 6.2 cm sq. The MR is extremely severe. By the PISA method, the  effective regurgitant orifice area is 1.45 cm sq, regurgitant volume is  108 ml, regurgitant fraction 85%. . 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 No evidence of mitral stenosis. The mean mitral valve  gradient is 2.3 mmHg with average  heart rate of 101 bpm.   6. The tricuspid valve is abnormal. Tricuspid valve regurgitation is  moderate.   7. The aortic valve is tricuspid. Aortic valve regurgitation is not  visualized.   8. There is spontaneous echo contrast in the descending aorta, consistent  with very low cardiac output.   9. Evidence of atrial level shunting detected by color flow Doppler.  There is a small patent foramen ovale with bidirectional shunting across  atrial septum.  10. 3D performed of the mitral valve and demonstrates flail scallops of  the posterior leaflet due to ruptured chordae, with torrential mitral  insufficiency.     Neuro/Psych neg Seizures negative neurological ROS  negative psych ROS   GI/Hepatic negative GI ROS, Neg liver ROS,,,  Endo/Other  negative endocrine ROS    Renal/GU negative Renal ROS  negative genitourinary   Musculoskeletal negative musculoskeletal ROS (+)    Abdominal   Peds negative pediatric ROS (+)  Hematology negative hematology ROS (+)   Anesthesia Other Findings   Reproductive/Obstetrics negative OB ROS                             Anesthesia Physical Anesthesia Plan  ASA: 4  Anesthesia Plan: General   Post-op Pain Management:    Induction: Intravenous  PONV Risk Score and Plan: 2 and Ondansetron  and Treatment may vary due to age or medical condition  Airway Management Planned: Oral ETT  Additional Equipment: Arterial line, CVP, PA Cath, TEE, 3D  TEE and Ultrasound Guidance Line Placement  Intra-op Plan:   Post-operative Plan: Extubation in OR  Informed Consent: I have reviewed the patients History and Physical, chart, labs and discussed the procedure including the risks, benefits and alternatives for the proposed anesthesia with the patient or authorized representative who has indicated his/her understanding and acceptance.     Dental advisory given  Plan Discussed with: CRNA  Anesthesia Plan Comments:  (Admitted on 4/28 in decompensated HF 2/2 severe MR in the setting of flail posterior leaflet. 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 Now s/p ~10L net negative from diuresis and almost 15 kg drop in weight. Repeat RHC with PA pressures 32/18, PAPI 5.    Continue milrinone  intraoperative Plan for Vaso, epi, levo infusions. Aline, CVL, Pa catheter, TEE )        Anesthesia Quick Evaluation

## 2023-05-22 NOTE — Progress Notes (Signed)
 Mobility Specialist Progress Note:    05/22/23 1208  Mobility  Activity Ambulated with assistance in hallway  Level of Assistance Standby assist, set-up cues, supervision of patient - no hands on  Assistive Device None  Distance Ambulated (ft) 500 ft  Activity Response Tolerated well  Mobility Referral Yes  Mobility visit 1 Mobility  Mobility Specialist Start Time (ACUTE ONLY) 1015  Mobility Specialist Stop Time (ACUTE ONLY) 1028  Mobility Specialist Time Calculation (min) (ACUTE ONLY) 13 min   Received pt in bed, on 3L/min having no complaints and agreeable to mobility. Pt was asymptomatic throughout session. Ambulated on RA, VSS. Returned to room w/o fault. Left in bed w/ call bell in reach and all needs met. Left on 3L/min, RN aware.   Inetta Manes Mobility Specialist  Please contact vis Secure Chat or  Rehab Office [phone]

## 2023-05-22 NOTE — Plan of Care (Signed)

## 2023-05-22 NOTE — Progress Notes (Signed)
 Cardiologist:  Patwardhan  Subjective:  Denies SSCP, palpitations or Dyspnea Tachycardic on Telemetry   Objective:  Vitals:   05/22/23 0246 05/22/23 0500 05/22/23 0544 05/22/23 0731  BP: 102/77  102/77 124/88  Pulse: (!) 121     Resp: 20     Temp: 98 F (36.7 C)   97.6 F (36.4 C)  TempSrc: Oral   Oral  SpO2: 96%     Weight:  83.9 kg    Height:        Intake/Output from previous day:  Intake/Output Summary (Last 24 hours) at 05/22/2023 0924 Last data filed at 05/22/2023 0240 Gross per 24 hour  Intake 69 ml  Output 1050 ml  Net -981 ml    Physical Exam: No distress Lungs clear Loud apical MR murmur radiates anteriorly Abdomen benign No edema Sats 95%   Lab Results: Basic Metabolic Panel: Recent Labs    05/20/23 1448 05/21/23 0916  NA 127* 130*  K 3.8 3.6  CL 89* 92*  CO2 25 23  GLUCOSE 183* 123*  BUN 32* 35*  CREATININE 1.18 1.12  CALCIUM  10.1 10.2    CBC: Recent Labs    05/20/23 0924  HGB 18.7*  HCT 55.0*     Imaging: DG Chest 2 View Result Date: 05/21/2023 CLINICAL DATA:  Congestive heart failure EXAM: CHEST - 2 VIEW COMPARISON:  None Available. FINDINGS: Lungs volumes are small, but are symmetric and are clear. No pneumothorax or pleural effusion. Cardiac size within normal limits. Pulmonary vascularity is normal. Osseous structures are age-appropriate. No acute bone abnormality. IMPRESSION: 1. Pulmonary hypoinflation. Electronically Signed   By: Calvin Drake M.D.   On: 05/21/2023 11:52   ECHOCARDIOGRAM LIMITED Result Date: 05/20/2023    ECHOCARDIOGRAM LIMITED REPORT   Patient Name:   Calvin Drake Date of Exam: 05/20/2023 Medical Rec #:  [id number]      Height:       67.0 in Accession #:    [id number]     Weight:       196.4 lb Date of Birth:  [birth year]      BSA:          2.007 m Patient Age:    62 years       BP:           109/79 mmHg Patient Gender: M              HR:           117 bpm. Exam Location:  Inpatient Procedure: 2D Echo, Cardiac  Doppler and Color Doppler (Both Spectral and Color            Flow Doppler were utilized during procedure). Indications:    I50.9* Heart failure (unspecified)  History:        Patient has prior history of Echocardiogram examinations, most                 recent 04/22/2023.  Sonographer:    Andrena Bang Referring Phys: [phone] AMY D CLEGG IMPRESSIONS  1. Left ventricular ejection fraction, by estimation, is 60 to 65%. The left ventricle has normal function. The left ventricle has no regional wall motion abnormalities.  2. The RV is not thoroughly visualized, however, right ventricular systolic function appears at least mildly reduced. The right ventricular size is mildly enlarged.  3. The mitral valve is abnormal. Severe 4 mitral valve regurgitation secondary to flail posterior leaflet. No evidence of mitral stenosis.  4. The aortic valve is normal  in structure. Aortic valve regurgitation is not visualized. No aortic stenosis is present.  5. The inferior vena cava is normal in size with greater than 50% respiratory variability, suggesting right atrial pressure of 3 mmHg. FINDINGS  Left Ventricle: Left ventricular ejection fraction, by estimation, is 60 to 65%. The left ventricle has normal function. The left ventricle has no regional wall motion abnormalities. The left ventricular internal cavity size was normal in size. There is  no left ventricular hypertrophy. Right Ventricle: The right ventricular size is mildly enlarged. No increase in right ventricular wall thickness. Right ventricular systolic function is mildly reduced. Left Atrium: Left atrial size was normal in size. Right Atrium: Right atrial size was normal in size. Pericardium: There is no evidence of pericardial effusion. Mitral Valve: The mitral valve is abnormal. Severe mitral valve regurgitation. No evidence of mitral valve stenosis. Tricuspid Valve: The tricuspid valve is normal in structure. Tricuspid valve regurgitation is trivial. No evidence of  tricuspid stenosis. Aortic Valve: The aortic valve is normal in structure. Aortic valve regurgitation is not visualized. No aortic stenosis is present. Pulmonic Valve: The pulmonic valve was normal in structure. Pulmonic valve regurgitation is mild. No evidence of pulmonic stenosis. Aorta: The aortic root is normal in size and structure. Venous: The inferior vena cava is normal in size with greater than 50% respiratory variability, suggesting right atrial pressure of 3 mmHg. IAS/Shunts: No atrial level shunt detected by color flow Doppler.  LV Volumes (MOD) LV vol d, MOD A2C: 130.0 ml Diastology LV vol d, MOD A4C: 169.0 ml LV e' medial:    8.21 cm/s LV vol s, MOD A2C: 48.7 ml  LV E/e' medial:  13.4 LV vol s, MOD A4C: 58.9 ml  LV e' lateral:   21.80 cm/s LV SV MOD A2C:     81.3 ml  LV E/e' lateral: 5.0 LV SV MOD A4C:     169.0 ml LV SV MOD BP:      98.3 ml MITRAL VALVE MV Area (PHT): 5.09 cm MV Decel Time: 149 msec MV E velocity: 110.00 cm/s MV A velocity: 64.00 cm/s MV E/A ratio:  1.72 Calvin Drake Electronically signed by Calvin Drake Signature Date/Time: 05/20/2023/2:55:47 PM    Final     Cardiac Studies:  ECG: ST rate 108    Telemetry: ? ST vs flutter rate 115  Echo: flail posterior leaflet with torrential MR EF 50-55%   Medications:    aspirin  EC  81 mg Oral Daily   cyclobenzaprine   5 mg Oral QHS   [START ON 05/23/2023] epinephrine   0-10 mcg/min Intravenous To OR   furosemide   40 mg Oral Daily   [START ON 05/23/2023] heparin  sodium (porcine) 2,500 Units, papaverine 30 mg in electrolyte-A (PLASMALYTE-A PH 7.4) 500 mL irrigation   Irrigation To OR   hydrALAZINE   10 mg Oral Q8H   [START ON 05/23/2023] insulin    Intravenous To OR   [START ON 05/23/2023] [facility] Blood Cardioplegia vial (lidocaine /magnesium /mannitol 0.26g-4g-6.4g)   Intracoronary To OR   melatonin  3 mg Oral QHS   [START ON 05/23/2023] phenylephrine   30-200 mcg/min Intravenous To OR   [START ON 05/23/2023] potassium chloride   80 mEq  Other To OR   potassium chloride   40 mEq Oral Once   rosuvastatin   40 mg Oral Daily   sodium chloride  flush  3 mL Intravenous Q12H   spironolactone   25 mg Oral Daily   [START ON 05/23/2023] tranexamic acid   15 mg/kg Intravenous To OR   [START  ON 05/23/2023] tranexamic acid   2 mg/kg Intracatheter To OR      [START ON 05/23/2023]  ceFAZolin  (ANCEF ) IV     [START ON 05/23/2023]  ceFAZolin  (ANCEF ) IV     [START ON 05/23/2023] dexmedetomidine      [START ON 05/23/2023] heparin  30,000 units/NS 1000 mL solution for CELLSAVER     milrinone  0.25 mcg/kg/min (05/22/23 0142)   [START ON 05/23/2023] milrinone      [START ON 05/23/2023] nitroGLYCERIN      [START ON 05/23/2023] norepinephrine      [START ON 05/23/2023] tranexamic acid  (CYKLOKAPRON ) 2,500 mg in sodium chloride  0.9 % 250 mL (10 mg/mL) infusion     [START ON 05/23/2023] vancomycin       Assessment/Plan:   MR: flail posterior leaflet Hopefully MVR with repair Dr Luna Salinas Monday On Aldactone  I/O's minus a liter. Stable ECG confirms ST not flutter CI only 1.47 at cath defer beta blocker with low forward output and recent CHF. Repeat Right heart cath 05/20/23 with PCWP 14 Euvolemic on exam CXR 5/3 hypoinflation no CHF If patient decompensates before surgery with recurrent CHF will need IABP  Janelle Mediate 05/22/2023, 9:24 AM

## 2023-05-22 NOTE — Plan of Care (Signed)
  Problem: Education: Goal: Understanding of CV disease, CV risk reduction, and recovery process will improve Outcome: Progressing   Problem: Education: Goal: Individualized Educational Video(s) Outcome: Progressing   Problem: Activity: Goal: Ability to return to baseline activity level will improve Outcome: Progressing   

## 2023-05-23 ENCOUNTER — Encounter (HOSPITAL_COMMUNITY)
Admission: RE | Disposition: A | Discharge status: Home / Self Care | Payer: Self-pay | Source: Home / Self Care | Attending: Thoracic Surgery (Cardiothoracic Vascular Surgery)

## 2023-05-23 ENCOUNTER — Inpatient Hospital Stay (HOSPITAL_COMMUNITY): Payer: Self-pay

## 2023-05-23 ENCOUNTER — Inpatient Hospital Stay (HOSPITAL_COMMUNITY): Payer: Self-pay | Admitting: Certified Registered"

## 2023-05-23 ENCOUNTER — Encounter (HOSPITAL_COMMUNITY): Payer: Self-pay | Admitting: Cardiology

## 2023-05-23 ENCOUNTER — Other Ambulatory Visit: Payer: Self-pay

## 2023-05-23 DIAGNOSIS — Q2112 Patent foramen ovale: Secondary | ICD-10-CM

## 2023-05-23 DIAGNOSIS — I340 Nonrheumatic mitral (valve) insufficiency: Secondary | ICD-10-CM

## 2023-05-23 DIAGNOSIS — I5030 Unspecified diastolic (congestive) heart failure: Secondary | ICD-10-CM

## 2023-05-23 DIAGNOSIS — Z9889 Other specified postprocedural states: Secondary | ICD-10-CM

## 2023-05-23 HISTORY — PX: MITRAL VALVE REPAIR: SHX2039

## 2023-05-23 HISTORY — PX: INTRAOPERATIVE TRANSESOPHAGEAL ECHOCARDIOGRAM: SHX5062

## 2023-05-23 LAB — BASIC METABOLIC PANEL WITH GFR
Anion gap: 9 (ref 5–15)
BUN: 21 mg/dL (ref 8–23)
CO2: 20 mmol/L — ABNORMAL LOW (ref 22–32)
Calcium: 8.3 mg/dL — ABNORMAL LOW (ref 8.9–10.3)
Chloride: 107 mmol/L (ref 98–111)
Creatinine, Ser: 0.99 mg/dL (ref 0.61–1.24)
GFR, Estimated: >60 mL/min (ref >60)
Glucose, Bld: 155 mg/dL — ABNORMAL HIGH (ref 70–99)
Potassium: 3.5 mmol/L (ref 3.5–5.1)
Sodium: 136 mmol/L (ref 135–145)

## 2023-05-23 LAB — CBC
HCT: 30.8 % — ABNORMAL LOW (ref 39.0–52.0)
HCT: 37.4 % — ABNORMAL LOW (ref 39.0–52.0)
HCT: 50.7 % (ref 39.0–52.0)
Hemoglobin: 10.1 g/dL — ABNORMAL LOW (ref 13.0–17.0)
Hemoglobin: 12.3 g/dL — ABNORMAL LOW (ref 13.0–17.0)
Hemoglobin: 17.3 g/dL — ABNORMAL HIGH (ref 13.0–17.0)
MCH: 28.8 pg (ref 26.0–34.0)
MCH: 29 pg (ref 26.0–34.0)
MCH: 29.2 pg (ref 26.0–34.0)
MCHC: 32.8 g/dL (ref 30.0–36.0)
MCHC: 32.9 g/dL (ref 30.0–36.0)
MCHC: 34.1 g/dL (ref 30.0–36.0)
MCV: 84.4 fL (ref 80.0–100.0)
MCV: 88.5 fL (ref 80.0–100.0)
MCV: 88.8 fL (ref 80.0–100.0)
Platelets: 136 10*3/uL — ABNORMAL LOW (ref 150–400)
Platelets: 224 10*3/uL (ref 150–400)
Platelets: 96 10*3/uL — ABNORMAL LOW (ref 150–400)
RBC: 3.48 MIL/uL — ABNORMAL LOW (ref 4.22–5.81)
RBC: 4.21 MIL/uL — ABNORMAL LOW (ref 4.22–5.81)
RBC: 6.01 MIL/uL — ABNORMAL HIGH (ref 4.22–5.81)
RDW: 12.9 % (ref 11.5–15.5)
RDW: 12.9 % (ref 11.5–15.5)
RDW: 12.9 % (ref 11.5–15.5)
WBC: 13.5 10*3/uL — ABNORMAL HIGH (ref 4.0–10.5)
WBC: 16.8 10*3/uL — ABNORMAL HIGH (ref 4.0–10.5)
WBC: 24.3 10*3/uL — ABNORMAL HIGH (ref 4.0–10.5)
nRBC: 0 % (ref 0.0–0.2)
nRBC: 0 % (ref 0.0–0.2)
nRBC: 0 % (ref 0.0–0.2)

## 2023-05-23 LAB — POCT I-STAT, CHEM 8
BUN: 35 mg/dL — ABNORMAL HIGH (ref 8–23)
BUN: 28 mg/dL — ABNORMAL HIGH (ref 8–23)
BUN: 29 mg/dL — ABNORMAL HIGH (ref 8–23)
BUN: 30 mg/dL — ABNORMAL HIGH (ref 8–23)
BUN: 25 mg/dL — ABNORMAL HIGH (ref 8–23)
BUN: 25 mg/dL — ABNORMAL HIGH (ref 8–23)
Calcium, Ion: 1.29 mmol/L (ref 1.15–1.40)
Calcium, Ion: 1.11 mmol/L — ABNORMAL LOW (ref 1.15–1.40)
Calcium, Ion: 1.03 mmol/L — ABNORMAL LOW (ref 1.15–1.40)
Calcium, Ion: 1.01 mmol/L — ABNORMAL LOW (ref 1.15–1.40)
Calcium, Ion: 0.99 mmol/L — ABNORMAL LOW (ref 1.15–1.40)
Calcium, Ion: 1.2 mmol/L (ref 1.15–1.40)
Chloride: 91 mmol/L — ABNORMAL LOW (ref 98–111)
Chloride: 97 mmol/L — ABNORMAL LOW (ref 98–111)
Chloride: 93 mmol/L — ABNORMAL LOW (ref 98–111)
Chloride: 94 mmol/L — ABNORMAL LOW (ref 98–111)
Chloride: 100 mmol/L (ref 98–111)
Chloride: 103 mmol/L (ref 98–111)
Creatinine, Ser: 1.1 mg/dL (ref 0.61–1.24)
Creatinine, Ser: 0.9 mg/dL (ref 0.61–1.24)
Creatinine, Ser: 1 mg/dL (ref 0.61–1.24)
Creatinine, Ser: 0.9 mg/dL (ref 0.61–1.24)
Creatinine, Ser: 0.8 mg/dL (ref 0.61–1.24)
Creatinine, Ser: 0.8 mg/dL (ref 0.61–1.24)
Glucose, Bld: 134 mg/dL — ABNORMAL HIGH (ref 70–99)
Glucose, Bld: 161 mg/dL — ABNORMAL HIGH (ref 70–99)
Glucose, Bld: 132 mg/dL — ABNORMAL HIGH (ref 70–99)
Glucose, Bld: 118 mg/dL — ABNORMAL HIGH (ref 70–99)
Glucose, Bld: 124 mg/dL — ABNORMAL HIGH (ref 70–99)
Glucose, Bld: 162 mg/dL — ABNORMAL HIGH (ref 70–99)
HCT: 52 % (ref 39.0–52.0)
HCT: 44 % (ref 39.0–52.0)
HCT: 35 % — ABNORMAL LOW (ref 39.0–52.0)
HCT: 33 % — ABNORMAL LOW (ref 39.0–52.0)
HCT: 29 % — ABNORMAL LOW (ref 39.0–52.0)
HCT: 33 % — ABNORMAL LOW (ref 39.0–52.0)
Hemoglobin: 17.7 g/dL — ABNORMAL HIGH (ref 13.0–17.0)
Hemoglobin: 15 g/dL (ref 13.0–17.0)
Hemoglobin: 11.9 g/dL — ABNORMAL LOW (ref 13.0–17.0)
Hemoglobin: 11.2 g/dL — ABNORMAL LOW (ref 13.0–17.0)
Hemoglobin: 9.9 g/dL — ABNORMAL LOW (ref 13.0–17.0)
Hemoglobin: 11.2 g/dL — ABNORMAL LOW (ref 13.0–17.0)
Potassium: 3.2 mmol/L — ABNORMAL LOW (ref 3.5–5.1)
Potassium: 2.9 mmol/L — ABNORMAL LOW (ref 3.5–5.1)
Potassium: 4.1 mmol/L (ref 3.5–5.1)
Potassium: 4.1 mmol/L (ref 3.5–5.1)
Potassium: 3.5 mmol/L (ref 3.5–5.1)
Potassium: 3.4 mmol/L — ABNORMAL LOW (ref 3.5–5.1)
Sodium: 131 mmol/L — ABNORMAL LOW (ref 135–145)
Sodium: 132 mmol/L — ABNORMAL LOW (ref 135–145)
Sodium: 130 mmol/L — ABNORMAL LOW (ref 135–145)
Sodium: 130 mmol/L — ABNORMAL LOW (ref 135–145)
Sodium: 134 mmol/L — ABNORMAL LOW (ref 135–145)
Sodium: 134 mmol/L — ABNORMAL LOW (ref 135–145)
TCO2: 27 mmol/L (ref 22–32)
TCO2: 24 mmol/L (ref 22–32)
TCO2: 27 mmol/L (ref 22–32)
TCO2: 26 mmol/L (ref 22–32)
TCO2: 23 mmol/L (ref 22–32)
TCO2: 20 mmol/L — ABNORMAL LOW (ref 22–32)

## 2023-05-23 LAB — ECHO INTRAOPERATIVE TEE
AR max vel: 2.95 cm2
AV Area VTI: 2.65 cm2
AV Area mean vel: 2.49 cm2
AV Mean grad: 1.5 mmHg
AV Peak grad: 2.6 mmHg
Ao pk vel: 0.8 m/s
Height: 67 in
MV VTI: 1.01 cm2
Weight: 2967.99 [oz_av]

## 2023-05-23 LAB — POCT I-STAT 7, (LYTES, BLD GAS, ICA,H+H)
Acid-Base Excess: 1 mmol/L (ref 0.0–2.0)
Acid-base deficit: 5 mmol/L — ABNORMAL HIGH (ref 0.0–2.0)
Acid-base deficit: 9 mmol/L — ABNORMAL HIGH (ref 0.0–2.0)
Acid-base deficit: 5 mmol/L — ABNORMAL HIGH (ref 0.0–2.0)
Acid-base deficit: 7 mmol/L — ABNORMAL HIGH (ref 0.0–2.0)
Bicarbonate: 24.6 mmol/L (ref 20.0–28.0)
Bicarbonate: 19.9 mmol/L — ABNORMAL LOW (ref 20.0–28.0)
Bicarbonate: 17.2 mmol/L — ABNORMAL LOW (ref 20.0–28.0)
Bicarbonate: 20 mmol/L (ref 20.0–28.0)
Bicarbonate: 17.7 mmol/L — ABNORMAL LOW (ref 20.0–28.0)
Calcium, Ion: 0.96 mmol/L — ABNORMAL LOW (ref 1.15–1.40)
Calcium, Ion: 1.21 mmol/L (ref 1.15–1.40)
Calcium, Ion: 1.14 mmol/L — ABNORMAL LOW (ref 1.15–1.40)
Calcium, Ion: 1.26 mmol/L (ref 1.15–1.40)
Calcium, Ion: 1.22 mmol/L (ref 1.15–1.40)
HCT: 33 % — ABNORMAL LOW (ref 39.0–52.0)
HCT: 31 % — ABNORMAL LOW (ref 39.0–52.0)
HCT: 35 % — ABNORMAL LOW (ref 39.0–52.0)
HCT: 29 % — ABNORMAL LOW (ref 39.0–52.0)
HCT: 29 % — ABNORMAL LOW (ref 39.0–52.0)
Hemoglobin: 11.2 g/dL — ABNORMAL LOW (ref 13.0–17.0)
Hemoglobin: 10.5 g/dL — ABNORMAL LOW (ref 13.0–17.0)
Hemoglobin: 11.9 g/dL — ABNORMAL LOW (ref 13.0–17.0)
Hemoglobin: 9.9 g/dL — ABNORMAL LOW (ref 13.0–17.0)
Hemoglobin: 9.9 g/dL — ABNORMAL LOW (ref 13.0–17.0)
O2 Saturation: 100 %
O2 Saturation: 100 %
O2 Saturation: 93 %
O2 Saturation: 98 %
O2 Saturation: 98 %
Patient temperature: 36
Patient temperature: 37
Patient temperature: 37.2
Potassium: 3.3 mmol/L — ABNORMAL LOW (ref 3.5–5.1)
Potassium: 3.4 mmol/L — ABNORMAL LOW (ref 3.5–5.1)
Potassium: 3.3 mmol/L — ABNORMAL LOW (ref 3.5–5.1)
Potassium: 3.8 mmol/L (ref 3.5–5.1)
Potassium: 3.5 mmol/L (ref 3.5–5.1)
Sodium: 131 mmol/L — ABNORMAL LOW (ref 135–145)
Sodium: 134 mmol/L — ABNORMAL LOW (ref 135–145)
Sodium: 136 mmol/L (ref 135–145)
Sodium: 137 mmol/L (ref 135–145)
Sodium: 138 mmol/L (ref 135–145)
TCO2: 26 mmol/L (ref 22–32)
TCO2: 21 mmol/L — ABNORMAL LOW (ref 22–32)
TCO2: 18 mmol/L — ABNORMAL LOW (ref 22–32)
TCO2: 21 mmol/L — ABNORMAL LOW (ref 22–32)
TCO2: 19 mmol/L — ABNORMAL LOW (ref 22–32)
pCO2 arterial: 35.2 mmHg (ref 32–48)
pCO2 arterial: 34.1 mmHg (ref 32–48)
pCO2 arterial: 35.5 mmHg (ref 32–48)
pCO2 arterial: 34.1 mmHg (ref 32–48)
pCO2 arterial: 30.3 mmHg — ABNORMAL LOW (ref 32–48)
pH, Arterial: 7.453 — ABNORMAL HIGH (ref 7.35–7.45)
pH, Arterial: 7.374 (ref 7.35–7.45)
pH, Arterial: 7.289 — ABNORMAL LOW (ref 7.35–7.45)
pH, Arterial: 7.376 (ref 7.35–7.45)
pH, Arterial: 7.375 (ref 7.35–7.45)
pO2, Arterial: 353 mmHg — ABNORMAL HIGH (ref 83–108)
pO2, Arterial: 285 mmHg — ABNORMAL HIGH (ref 83–108)
pO2, Arterial: 71 mmHg — ABNORMAL LOW (ref 83–108)
pO2, Arterial: 115 mmHg — ABNORMAL HIGH (ref 83–108)
pO2, Arterial: 113 mmHg — ABNORMAL HIGH (ref 83–108)

## 2023-05-23 LAB — POCT I-STAT EG7
Acid-Base Excess: 2 mmol/L (ref 0.0–2.0)
Bicarbonate: 25.9 mmol/L (ref 20.0–28.0)
Calcium, Ion: 1.01 mmol/L — ABNORMAL LOW (ref 1.15–1.40)
HCT: 34 % — ABNORMAL LOW (ref 39.0–52.0)
Hemoglobin: 11.6 g/dL — ABNORMAL LOW (ref 13.0–17.0)
O2 Saturation: 84 %
Potassium: 3 mmol/L — ABNORMAL LOW (ref 3.5–5.1)
Sodium: 132 mmol/L — ABNORMAL LOW (ref 135–145)
TCO2: 27 mmol/L (ref 22–32)
pCO2, Ven: 37.2 mmHg — ABNORMAL LOW (ref 44–60)
pH, Ven: 7.45 — ABNORMAL HIGH (ref 7.25–7.43)
pO2, Ven: 46 mmHg — ABNORMAL HIGH (ref 32–45)

## 2023-05-23 LAB — COOXEMETRY PANEL
Carboxyhemoglobin: 2.1 % — ABNORMAL HIGH (ref 0.5–1.5)
Carboxyhemoglobin: 1.2 % (ref 0.5–1.5)
Methemoglobin: <0.7 % (ref 0.0–1.5)
Methemoglobin: 0.8 % (ref 0.0–1.5)
O2 Saturation: 66.8 %
O2 Saturation: 55.4 %
Total hemoglobin: 10.5 g/dL — ABNORMAL LOW (ref 12.0–16.0)
Total hemoglobin: 9.9 g/dL — ABNORMAL LOW (ref 12.0–16.0)

## 2023-05-23 LAB — SURGICAL PCR SCREEN
MRSA, PCR: NEGATIVE
Staphylococcus aureus: NEGATIVE

## 2023-05-23 LAB — HEMOGLOBIN AND HEMATOCRIT, BLOOD
HCT: 29.1 % — ABNORMAL LOW (ref 39.0–52.0)
Hemoglobin: 9.7 g/dL — ABNORMAL LOW (ref 13.0–17.0)

## 2023-05-23 LAB — COMPREHENSIVE METABOLIC PANEL WITH GFR
ALT: 42 U/L (ref 0–44)
AST: 48 U/L — ABNORMAL HIGH (ref 15–41)
Albumin: 3.4 g/dL — ABNORMAL LOW (ref 3.5–5.0)
Alkaline Phosphatase: 138 U/L — ABNORMAL HIGH (ref 38–126)
Anion gap: 14 (ref 5–15)
BUN: 34 mg/dL — ABNORMAL HIGH (ref 8–23)
CO2: 23 mmol/L (ref 22–32)
Calcium: 9.6 mg/dL (ref 8.9–10.3)
Chloride: 89 mmol/L — ABNORMAL LOW (ref 98–111)
Creatinine, Ser: 1.08 mg/dL (ref 0.61–1.24)
GFR, Estimated: >60 mL/min (ref >60)
Glucose, Bld: 155 mg/dL — ABNORMAL HIGH (ref 70–99)
Potassium: 3.4 mmol/L — ABNORMAL LOW (ref 3.5–5.1)
Sodium: 126 mmol/L — ABNORMAL LOW (ref 135–145)
Total Bilirubin: 1.2 mg/dL (ref 0.0–1.2)
Total Protein: 8.3 g/dL — ABNORMAL HIGH (ref 6.5–8.1)

## 2023-05-23 LAB — GLUCOSE, CAPILLARY
Glucose-Capillary: 148 mg/dL — ABNORMAL HIGH (ref 70–99)
Glucose-Capillary: 162 mg/dL — ABNORMAL HIGH (ref 70–99)
Glucose-Capillary: 181 mg/dL — ABNORMAL HIGH (ref 70–99)
Glucose-Capillary: 174 mg/dL — ABNORMAL HIGH (ref 70–99)
Glucose-Capillary: 178 mg/dL — ABNORMAL HIGH (ref 70–99)
Glucose-Capillary: 163 mg/dL — ABNORMAL HIGH (ref 70–99)
Glucose-Capillary: 154 mg/dL — ABNORMAL HIGH (ref 70–99)
Glucose-Capillary: 152 mg/dL — ABNORMAL HIGH (ref 70–99)
Glucose-Capillary: 183 mg/dL — ABNORMAL HIGH (ref 70–99)
Glucose-Capillary: 200 mg/dL — ABNORMAL HIGH (ref 70–99)
Glucose-Capillary: 165 mg/dL — ABNORMAL HIGH (ref 70–99)

## 2023-05-23 LAB — PROTIME-INR
INR: 1.1 (ref 0.8–1.2)
INR: 1.8 — ABNORMAL HIGH (ref 0.8–1.2)
Prothrombin Time: 14.5 s (ref 11.4–15.2)
Prothrombin Time: 20.8 s — ABNORMAL HIGH (ref 11.4–15.2)

## 2023-05-23 LAB — APTT
aPTT: 30 s (ref 24–36)
aPTT: 39 s — ABNORMAL HIGH (ref 24–36)

## 2023-05-23 LAB — MAGNESIUM: Magnesium: 3 mg/dL — ABNORMAL HIGH (ref 1.7–2.4)

## 2023-05-23 LAB — ABO/RH: ABO/RH(D): B POS

## 2023-05-23 LAB — PLATELET COUNT: Platelets: 95 10*3/uL — ABNORMAL LOW (ref 150–400)

## 2023-05-23 SURGERY — REPAIR, MITRAL VALVE
Anesthesia: General

## 2023-05-23 MED ORDER — SODIUM CHLORIDE 0.9 % IV SOLN: 250.0000 mL | INTRAVENOUS | Status: AC

## 2023-05-23 MED ORDER — ACETAMINOPHEN 500 MG PO TABS
1000.0000 mg | ORAL_TABLET | Freq: Four times a day (QID) | ORAL | Status: AC
  Administered 2023-05-23 – 2023-05-28 (×19): 1000 mg via ORAL
  Filled 2023-05-23 (×17): qty 2

## 2023-05-23 MED ORDER — HEPARIN SODIUM (PORCINE) 1000 UNIT/ML IJ SOLN
INTRAMUSCULAR | Status: DC | PRN
Start: 2023-05-23 — End: 2023-05-23
  Administered 2023-05-23: 30000 [IU] via INTRAVENOUS

## 2023-05-23 MED ORDER — PANTOPRAZOLE SODIUM 40 MG IV SOLR
40.0000 mg | Freq: Every day | INTRAVENOUS | Status: AC
  Administered 2023-05-23 – 2023-05-24 (×2): 40 mg via INTRAVENOUS
  Filled 2023-05-23 (×2): qty 10

## 2023-05-23 MED ORDER — VASOPRESSIN 20 UNIT/ML IV SOLN
INTRAVENOUS | Status: DC | PRN
  Administered 2023-05-23: 2 [IU] via INTRAVENOUS
  Administered 2023-05-23: 1 [IU] via INTRAVENOUS
  Administered 2023-05-23: 2 [IU] via INTRAVENOUS
  Administered 2023-05-23 (×7): 1 [IU] via INTRAVENOUS

## 2023-05-23 MED ORDER — ONDANSETRON HCL 4 MG/2ML IJ SOLN
4.0000 mg | Freq: Four times a day (QID) | INTRAMUSCULAR | Status: DC | PRN
  Administered 2023-05-26: 4 mg via INTRAVENOUS
  Filled 2023-05-23: qty 2

## 2023-05-23 MED ORDER — SODIUM BICARBONATE 8.4 % IV SOLN
100.0000 meq | Freq: Once | INTRAVENOUS | Status: AC
  Administered 2023-05-23: 100 meq via INTRAVENOUS

## 2023-05-23 MED ORDER — FENTANYL CITRATE (PF) 250 MCG/5ML IJ SOLN
INTRAMUSCULAR | Status: AC
  Filled 2023-05-23: qty 5

## 2023-05-23 MED ORDER — SODIUM CHLORIDE 0.9 % IV SOLN
INTRAVENOUS | Status: DC | PRN
Start: 2023-05-23 — End: 2023-05-23

## 2023-05-23 MED ORDER — MIDAZOLAM HCL 2 MG/2ML IJ SOLN
2.0000 mg | INTRAMUSCULAR | Status: DC | PRN
  Administered 2023-05-23: 2 mg via INTRAVENOUS
  Filled 2023-05-23: qty 2

## 2023-05-23 MED ORDER — MIDAZOLAM HCL (PF) 5 MG/ML IJ SOLN
INTRAMUSCULAR | Status: DC | PRN
  Administered 2023-05-23: 2 mg via INTRAVENOUS

## 2023-05-23 MED ORDER — SODIUM CHLORIDE (PF) 0.9 % IJ SOLN: OROMUCOSAL | Status: DC | PRN

## 2023-05-23 MED ORDER — MELATONIN 3 MG PO TABS
3.0000 mg | ORAL_TABLET | Freq: Every day | ORAL | Status: DC
  Administered 2023-05-24 – 2023-05-31 (×8): 3 mg via ORAL
  Filled 2023-05-23 (×9): qty 1

## 2023-05-23 MED ORDER — BISACODYL 5 MG PO TBEC
10.0000 mg | DELAYED_RELEASE_TABLET | Freq: Every day | ORAL | Status: DC
  Administered 2023-05-24 – 2023-05-28 (×5): 10 mg via ORAL
  Filled 2023-05-23 (×8): qty 2

## 2023-05-23 MED ORDER — SODIUM CHLORIDE 0.9% IV SOLUTION: Freq: Once | INTRAVENOUS | Status: AC

## 2023-05-23 MED ORDER — EPINEPHRINE HCL 5 MG/250ML IV SOLN IN NS
0.0000 ug/min | INTRAVENOUS | Status: DC
  Administered 2023-05-23: 8 ug/min via INTRAVENOUS
  Administered 2023-05-24: 7 ug/min via INTRAVENOUS
  Administered 2023-05-25: 1 ug/min via INTRAVENOUS
  Filled 2023-05-23 (×3): qty 250

## 2023-05-23 MED ORDER — METOCLOPRAMIDE HCL 5 MG/ML IJ SOLN
10.0000 mg | Freq: Four times a day (QID) | INTRAMUSCULAR | Status: AC
  Administered 2023-05-23 – 2023-05-24 (×6): 10 mg via INTRAVENOUS
  Filled 2023-05-23 (×6): qty 2

## 2023-05-23 MED ORDER — CHLORHEXIDINE GLUCONATE 0.12 % MT SOLN
15.0000 mL | OROMUCOSAL | Status: AC
  Administered 2023-05-23: 15 mL via OROMUCOSAL
  Filled 2023-05-23: qty 15

## 2023-05-23 MED ORDER — PHENYLEPHRINE 80 MCG/ML (10ML) SYRINGE FOR IV PUSH (FOR BLOOD PRESSURE SUPPORT)
PREFILLED_SYRINGE | INTRAVENOUS | Status: AC
  Filled 2023-05-23: qty 10

## 2023-05-23 MED ORDER — ROCURONIUM BROMIDE 10 MG/ML (PF) SYRINGE
PREFILLED_SYRINGE | INTRAVENOUS | Status: AC
  Filled 2023-05-23: qty 10

## 2023-05-23 MED ORDER — METOPROLOL TARTRATE 5 MG/5ML IV SOLN: 2.5000 mg | INTRAVENOUS | Status: DC | PRN

## 2023-05-23 MED ORDER — NOREPINEPHRINE 4 MG/250ML-% IV SOLN
0.0000 ug/min | INTRAVENOUS | Status: DC
  Administered 2023-05-23: 2 ug/min via INTRAVENOUS
  Administered 2023-05-23: 9 ug/min via INTRAVENOUS
  Administered 2023-05-24: 7 ug/min via INTRAVENOUS
  Filled 2023-05-23 (×2): qty 250

## 2023-05-23 MED ORDER — PROPOFOL 10 MG/ML IV BOLUS
INTRAVENOUS | Status: AC
  Filled 2023-05-23: qty 20

## 2023-05-23 MED ORDER — MILRINONE LACTATE IN DEXTROSE 20-5 MG/100ML-% IV SOLN
0.2500 ug/kg/min | INTRAVENOUS | Status: DC
  Administered 2023-05-24 – 2023-05-28 (×7): 0.25 ug/kg/min via INTRAVENOUS
  Filled 2023-05-23 (×8): qty 100

## 2023-05-23 MED ORDER — CHLORHEXIDINE GLUCONATE CLOTH 2 % EX PADS
6.0000 | MEDICATED_PAD | Freq: Every day | CUTANEOUS | Status: DC
  Administered 2023-05-23 – 2023-06-02 (×10): 6 via TOPICAL

## 2023-05-23 MED ORDER — EPINEPHRINE 1 MG/10ML IJ SOSY
PREFILLED_SYRINGE | INTRAMUSCULAR | Status: DC | PRN
  Administered 2023-05-23: 10 ug via INTRAVENOUS

## 2023-05-23 MED ORDER — ALBUMIN HUMAN 5 % IV SOLN
250.0000 mL | INTRAVENOUS | Status: AC | PRN
  Administered 2023-05-23 – 2023-05-24 (×5): 12.5 g via INTRAVENOUS
  Filled 2023-05-23 (×3): qty 250

## 2023-05-23 MED ORDER — PROTAMINE SULFATE 10 MG/ML IV SOLN
INTRAVENOUS | Status: DC | PRN
  Administered 2023-05-23: 300 mg via INTRAVENOUS

## 2023-05-23 MED ORDER — SODIUM CHLORIDE 0.9 % IV SOLN: INTRAVENOUS | Status: AC

## 2023-05-23 MED ORDER — HEPARIN SODIUM (PORCINE) 1000 UNIT/ML IJ SOLN
INTRAMUSCULAR | Status: AC
  Filled 2023-05-23: qty 10

## 2023-05-23 MED ORDER — POTASSIUM CHLORIDE CRYS ER 20 MEQ PO TBCR
20.0000 meq | EXTENDED_RELEASE_TABLET | ORAL | Status: AC
  Administered 2023-05-23 – 2023-05-24 (×3): 20 meq via ORAL
  Filled 2023-05-23 (×3): qty 1

## 2023-05-23 MED ORDER — PROPOFOL 500 MG/50ML IV EMUL
INTRAVENOUS | Status: DC | PRN
Start: 2023-05-23 — End: 2023-05-23
  Administered 2023-05-23: 25 ug/kg/min via INTRAVENOUS

## 2023-05-23 MED ORDER — NITROGLYCERIN IN D5W 200-5 MCG/ML-% IV SOLN: 0.0000 ug/min | INTRAVENOUS | Status: DC

## 2023-05-23 MED ORDER — ROCURONIUM BROMIDE 10 MG/ML (PF) SYRINGE
PREFILLED_SYRINGE | INTRAVENOUS | Status: DC | PRN
  Administered 2023-05-23: 30 mg via INTRAVENOUS
  Administered 2023-05-23: 100 mg via INTRAVENOUS
  Administered 2023-05-23: 30 mg via INTRAVENOUS

## 2023-05-23 MED ORDER — MIDAZOLAM HCL (PF) 10 MG/2ML IJ SOLN
INTRAMUSCULAR | Status: AC
  Filled 2023-05-23: qty 2

## 2023-05-23 MED ORDER — INSULIN REGULAR(HUMAN) IN NACL 100-0.9 UT/100ML-% IV SOLN
INTRAVENOUS | Status: AC
  Administered 2023-05-23: 12 [IU]/h via INTRAVENOUS
  Administered 2023-05-24: 10 [IU]/h via INTRAVENOUS
  Filled 2023-05-23 (×2): qty 100

## 2023-05-23 MED ORDER — FENTANYL CITRATE (PF) 250 MCG/5ML IJ SOLN
INTRAMUSCULAR | Status: AC
Start: 2023-05-23 — End: ?
  Filled 2023-05-23: qty 5

## 2023-05-23 MED ORDER — HEMOSTATIC AGENTS (NO CHARGE) OPTIME
TOPICAL | Status: DC | PRN
Start: 2023-05-23 — End: 2023-05-23
  Administered 2023-05-23: 2 via TOPICAL

## 2023-05-23 MED ORDER — NOREPINEPHRINE 4 MG/250ML-% IV SOLN
INTRAVENOUS | Status: AC
Start: 2023-05-23 — End: 2023-05-24
  Filled 2023-05-23: qty 250

## 2023-05-23 MED ORDER — TRAMADOL HCL 50 MG PO TABS
50.0000 mg | ORAL_TABLET | ORAL | Status: DC | PRN
  Administered 2023-05-24 (×2): 100 mg via ORAL
  Filled 2023-05-23 (×2): qty 2

## 2023-05-23 MED ORDER — OXYCODONE HCL 5 MG PO TABS
5.0000 mg | ORAL_TABLET | ORAL | Status: DC | PRN
Start: 2023-05-23 — End: 2023-06-02
  Administered 2023-05-23: 5 mg via ORAL
  Administered 2023-05-24: 10 mg via ORAL
  Administered 2023-05-24: 5 mg via ORAL
  Administered 2023-05-24: 10 mg via ORAL
  Administered 2023-05-25: 5 mg via ORAL
  Filled 2023-05-23: qty 2
  Filled 2023-05-23 (×2): qty 1
  Filled 2023-05-23 (×2): qty 2

## 2023-05-23 MED ORDER — MORPHINE SULFATE (PF) 2 MG/ML IV SOLN: 1.0000 mg | INTRAVENOUS | Status: DC | PRN

## 2023-05-23 MED ORDER — METOPROLOL TARTRATE 25 MG/10 ML ORAL SUSPENSION: 12.5000 mg | Freq: Two times a day (BID) | ORAL | Status: DC

## 2023-05-23 MED ORDER — ASPIRIN 325 MG PO TBEC
325.0000 mg | DELAYED_RELEASE_TABLET | Freq: Every day | ORAL | Status: DC
  Administered 2023-05-24 – 2023-06-02 (×10): 325 mg via ORAL
  Filled 2023-05-23 (×10): qty 1

## 2023-05-23 MED ORDER — CEFAZOLIN SODIUM-DEXTROSE 2-4 GM/100ML-% IV SOLN
2.0000 g | Freq: Three times a day (TID) | INTRAVENOUS | Status: AC
  Administered 2023-05-23 – 2023-05-25 (×6): 2 g via INTRAVENOUS
  Filled 2023-05-23 (×6): qty 100

## 2023-05-23 MED ORDER — LACTATED RINGERS IV SOLN
INTRAVENOUS | Status: AC
Start: 2023-05-23 — End: 2023-05-24

## 2023-05-23 MED ORDER — LIDOCAINE 2% (20 MG/ML) 5 ML SYRINGE
INTRAMUSCULAR | Status: DC | PRN
  Administered 2023-05-23: 100 mg via INTRAVENOUS

## 2023-05-23 MED ORDER — PROTAMINE SULFATE 10 MG/ML IV SOLN
INTRAVENOUS | Status: AC
  Filled 2023-05-23: qty 25

## 2023-05-23 MED ORDER — LACTATED RINGERS IV SOLN: INTRAVENOUS | Status: DC | PRN

## 2023-05-23 MED ORDER — 0.9 % SODIUM CHLORIDE (POUR BTL) OPTIME
TOPICAL | Status: DC | PRN
Start: 2023-05-23 — End: 2023-05-23
  Administered 2023-05-23 (×4): 1000 mL
  Administered 2023-05-23: 5000 mL

## 2023-05-23 MED ORDER — VASOPRESSIN 20 UNIT/ML IV SOLN
INTRAVENOUS | Status: AC
  Filled 2023-05-23: qty 1

## 2023-05-23 MED ORDER — DEXTROSE 50 % IV SOLN: 0.0000 mL | INTRAVENOUS | Status: DC | PRN

## 2023-05-23 MED ORDER — ORAL CARE MOUTH RINSE: 15.0000 mL | Freq: Once | OROMUCOSAL | Status: DC

## 2023-05-23 MED ORDER — BISACODYL 10 MG RE SUPP
10.0000 mg | Freq: Every day | RECTAL | Status: DC
  Administered 2023-05-28: 10 mg via RECTAL
  Filled 2023-05-23: qty 1

## 2023-05-23 MED ORDER — SODIUM CHLORIDE 0.9% FLUSH
3.0000 mL | Freq: Two times a day (BID) | INTRAVENOUS | Status: DC
  Administered 2023-05-24 – 2023-05-29 (×10): 3 mL via INTRAVENOUS

## 2023-05-23 MED ORDER — ACETAMINOPHEN 160 MG/5ML PO SOLN
650.0000 mg | Freq: Once | ORAL | Status: AC
  Administered 2023-05-23: 650 mg
  Filled 2023-05-23: qty 20.3

## 2023-05-23 MED ORDER — VANCOMYCIN HCL IN DEXTROSE 1-5 GM/200ML-% IV SOLN
1000.0000 mg | Freq: Once | INTRAVENOUS | Status: AC
  Administered 2023-05-23: 1000 mg via INTRAVENOUS
  Filled 2023-05-23: qty 200

## 2023-05-23 MED ORDER — PROPOFOL 10 MG/ML IV BOLUS
INTRAVENOUS | Status: DC | PRN
  Administered 2023-05-23: 50 mg via INTRAVENOUS

## 2023-05-23 MED ORDER — LACTATED RINGERS IV SOLN: INTRAVENOUS | Status: DC

## 2023-05-23 MED ORDER — METOPROLOL TARTRATE 12.5 MG HALF TABLET: 12.5000 mg | ORAL_TABLET | Freq: Two times a day (BID) | ORAL | Status: DC

## 2023-05-23 MED ORDER — PLASMA-LYTE A IV SOLN: INTRAVENOUS | Status: DC | PRN

## 2023-05-23 MED ORDER — CHLORHEXIDINE GLUCONATE 0.12 % MT SOLN: 15.0000 mL | Freq: Once | OROMUCOSAL | Status: DC

## 2023-05-23 MED ORDER — VASOPRESSIN 20 UNITS/100 ML INFUSION FOR SHOCK
0.0000 [IU]/min | INTRAVENOUS | Status: DC
  Administered 2023-05-23 – 2023-05-24 (×2): 0.04 [IU]/min via INTRAVENOUS
  Administered 2023-05-24 – 2023-05-25 (×2): 0.02 [IU]/min via INTRAVENOUS
  Filled 2023-05-23 (×4): qty 100

## 2023-05-23 MED ORDER — DOCUSATE SODIUM 100 MG PO CAPS
200.0000 mg | ORAL_CAPSULE | Freq: Every day | ORAL | Status: DC
  Administered 2023-05-24 – 2023-06-01 (×9): 200 mg via ORAL
  Filled 2023-05-23 (×10): qty 2

## 2023-05-23 MED ORDER — HEPARIN SODIUM (PORCINE) 1000 UNIT/ML IJ SOLN
INTRAMUSCULAR | Status: AC
  Filled 2023-05-23: qty 1

## 2023-05-23 MED ORDER — PHENYLEPHRINE 80 MCG/ML (10ML) SYRINGE FOR IV PUSH (FOR BLOOD PRESSURE SUPPORT)
PREFILLED_SYRINGE | INTRAVENOUS | Status: DC | PRN
  Administered 2023-05-23: 160 ug via INTRAVENOUS

## 2023-05-23 MED ORDER — VASOPRESSIN 20 UNIT/ML IV SOLN: 0.0000 [IU]/min | INTRAVENOUS | Status: DC

## 2023-05-23 MED ORDER — LACTATED RINGERS IV SOLN: INTRAVENOUS | Status: AC

## 2023-05-23 MED ORDER — DEXTROSE 5 % IV SOLN
0.0000 [IU]/min | INTRAVENOUS | Status: DC
  Filled 2023-05-23: qty 2.5

## 2023-05-23 MED ORDER — DEXMEDETOMIDINE HCL IN NACL 400 MCG/100ML IV SOLN
0.0000 ug/kg/h | INTRAVENOUS | Status: DC
  Administered 2023-05-23: 0.6 ug/kg/h via INTRAVENOUS
  Filled 2023-05-23: qty 100

## 2023-05-23 MED ORDER — ONDANSETRON HCL 4 MG/2ML IJ SOLN
INTRAMUSCULAR | Status: AC
  Filled 2023-05-23: qty 2

## 2023-05-23 MED ORDER — FENTANYL CITRATE (PF) 250 MCG/5ML IJ SOLN
INTRAMUSCULAR | Status: DC | PRN
Start: 2023-05-23 — End: 2023-05-23
  Administered 2023-05-23: 100 ug via INTRAVENOUS
  Administered 2023-05-23: 200 ug via INTRAVENOUS
  Administered 2023-05-23: 100 ug via INTRAVENOUS
  Administered 2023-05-23: 300 ug via INTRAVENOUS
  Administered 2023-05-23: 100 ug via INTRAVENOUS

## 2023-05-23 MED ORDER — ASPIRIN 81 MG PO CHEW
324.0000 mg | CHEWABLE_TABLET | Freq: Every day | ORAL | Status: DC
  Filled 2023-05-23: qty 4

## 2023-05-23 MED ORDER — ACETAMINOPHEN 160 MG/5ML PO SOLN: 1000.0000 mg | Freq: Four times a day (QID) | ORAL | Status: AC

## 2023-05-23 MED ORDER — MAGNESIUM SULFATE 4 GM/100ML IV SOLN
4.0000 g | Freq: Once | INTRAVENOUS | Status: AC
  Administered 2023-05-23: 4 g via INTRAVENOUS
  Filled 2023-05-23: qty 100

## 2023-05-23 MED ORDER — VASOPRESSIN 20 UNITS/100 ML INFUSION FOR SHOCK
0.0000 [IU]/min | INTRAVENOUS | Status: DC
  Administered 2023-05-23: .02 [IU]/min via INTRAVENOUS
  Filled 2023-05-23: qty 100

## 2023-05-23 MED ORDER — SUGAMMADEX SODIUM 200 MG/2ML IV SOLN
INTRAVENOUS | Status: DC | PRN
  Administered 2023-05-23: 200 mg via INTRAVENOUS

## 2023-05-23 MED ORDER — ASPIRIN 81 MG PO CHEW
324.0000 mg | CHEWABLE_TABLET | Freq: Once | ORAL | Status: AC
  Administered 2023-05-23: 324 mg via ORAL
  Filled 2023-05-23: qty 4

## 2023-05-23 MED ORDER — POTASSIUM CHLORIDE 10 MEQ/50ML IV SOLN
10.0000 meq | INTRAVENOUS | Status: AC
  Administered 2023-05-23 (×3): 10 meq via INTRAVENOUS

## 2023-05-23 MED ORDER — PHENYLEPHRINE HCL-NACL 20-0.9 MG/250ML-% IV SOLN: 0.0000 ug/min | INTRAVENOUS | Status: DC

## 2023-05-23 MED ORDER — CALCIUM CHLORIDE 10 % IV SOLN
1.0000 g | Freq: Once | INTRAVENOUS | Status: AC
  Administered 2023-05-23: 1 g via INTRAVENOUS

## 2023-05-23 MED ORDER — PROTAMINE SULFATE 10 MG/ML IV SOLN
INTRAVENOUS | Status: AC
  Filled 2023-05-23: qty 5

## 2023-05-23 MED ORDER — SODIUM CHLORIDE 0.45 % IV SOLN: INTRAVENOUS | Status: AC | PRN

## 2023-05-23 MED ORDER — SODIUM CHLORIDE 0.9% FLUSH: 3.0000 mL | INTRAVENOUS | Status: DC | PRN

## 2023-05-23 MED ORDER — PANTOPRAZOLE SODIUM 40 MG PO TBEC
40.0000 mg | DELAYED_RELEASE_TABLET | Freq: Every day | ORAL | Status: DC
  Administered 2023-05-25 – 2023-06-02 (×9): 40 mg via ORAL
  Filled 2023-05-23 (×9): qty 1

## 2023-05-23 SURGICAL SUPPLY — 90 items
ADAPTER CARDIO PERF ANTE/RETRO (ADAPTER) ×1 IMPLANT
BAG DECANTER FOR FLEXI CONT (MISCELLANEOUS) ×1 IMPLANT
BLADE CLIPPER SURG (BLADE) ×1 IMPLANT
BLADE STERNUM SYSTEM 6 (BLADE) ×1 IMPLANT
BLADE SURG 11 STRL SS (BLADE) IMPLANT
BLADE SURG 15 STRL LF DISP TIS (BLADE) ×1 IMPLANT
CANISTER SUCT 3000ML PPV (MISCELLANEOUS) ×1 IMPLANT
CANNULA AORTIC ROOT 9FR (CANNULA) ×1 IMPLANT
CANNULA ARTERIAL NVNT 3/8 22FR (MISCELLANEOUS) IMPLANT
CANNULA EZ GLIDE AORTIC 21FR (CANNULA) IMPLANT
CANNULA GUNDRY RCSP 15FR (MISCELLANEOUS) IMPLANT
CANNULA PRFSN 3/8XRT ANG TPR14 (MISCELLANEOUS) IMPLANT
CANNULA SUMP PERICARDIAL (CANNULA) ×1 IMPLANT
CANNULA VRC MALB SNGL STG 36FR (MISCELLANEOUS) IMPLANT
CATH ROBINSON RED A/P 18FR (CATHETERS) IMPLANT
CLIP FOGARTY SPRING 6M (CLIP) IMPLANT
CNTNR URN SCR LID CUP LEK RST (MISCELLANEOUS) IMPLANT
CONN 1/2X1/2X1/2 BEN (MISCELLANEOUS) ×1 IMPLANT
CONN ST 3/8 X 1/2 (MISCELLANEOUS) ×2 IMPLANT
CONN Y 3/8X3/8X3/8 BEN (MISCELLANEOUS) IMPLANT
CONTAINER PROTECT SURGISLUSH (MISCELLANEOUS) ×2 IMPLANT
COUNTER NDL 20CT MAGNET RED (NEEDLE) IMPLANT
DEVICE SUT CK QUICK LOAD MINI (Prosthesis & Implant Heart) IMPLANT
DRAIN CHANNEL X4 JP 24F (MISCELLANEOUS) IMPLANT
DRAPE SRG 135X102X78XABS (DRAPES) ×1 IMPLANT
DRAPE WARM FLUID 44X44 (DRAPES) ×1 IMPLANT
DRSG COVADERM 4X14 (GAUZE/BANDAGES/DRESSINGS) ×1 IMPLANT
ELECT CAUTERY BLADE 6.4 (BLADE) IMPLANT
ELECTRODE REM PT RTRN 9FT ADLT (ELECTROSURGICAL) ×2 IMPLANT
FELT TEFLON 1X6 (MISCELLANEOUS) ×2 IMPLANT
GAUZE 4X4 16PLY ~~LOC~~+RFID DBL (SPONGE) ×1 IMPLANT
GAUZE SPONGE 4X4 12PLY STRL (GAUZE/BANDAGES/DRESSINGS) ×2 IMPLANT
GAUZE SPONGE 4X4 12PLY STRL LF (GAUZE/BANDAGES/DRESSINGS) IMPLANT
GLOVE SS BIOGEL STRL SZ 7.5 (GLOVE) ×1 IMPLANT
GLOVE SURG SIGNA 7.5 PF LTX (GLOVE) IMPLANT
GOWN STRL REUS W/ TWL LRG LVL3 (GOWN DISPOSABLE) ×4 IMPLANT
HEMOSTAT POWDER SURGIFOAM 1G (HEMOSTASIS) ×3 IMPLANT
HEMOSTAT SURGICEL 2X14 (HEMOSTASIS) IMPLANT
INSERT FOGARTY XLG (MISCELLANEOUS) IMPLANT
KIT BASIN OR (CUSTOM PROCEDURE TRAY) ×1 IMPLANT
KIT SUCTION CATH 14FR (SUCTIONS) ×1 IMPLANT
KIT SUT CK MINI COMBO 4X17 (Prosthesis & Implant Heart) IMPLANT
KIT TURNOVER KIT B (KITS) ×1 IMPLANT
LINE VENT (MISCELLANEOUS) IMPLANT
LOOP VASCLR EXTRA MAXI WHITE (MISCELLANEOUS) ×1 IMPLANT
LOOPS VASCLR EXTRA MAXI WHITE (MISCELLANEOUS) ×1 IMPLANT
NS IRRIG 1000ML POUR BTL (IV SOLUTION) ×4 IMPLANT
PACK OPEN HEART (CUSTOM PROCEDURE TRAY) ×1 IMPLANT
PAD ARMBOARD POSITIONER FOAM (MISCELLANEOUS) ×2 IMPLANT
POSITIONER HEAD DONUT 9IN (MISCELLANEOUS) ×1 IMPLANT
RING ANLPLS SIMUFORM 32 (Prosthesis & Implant Heart) IMPLANT
RING ANLPLS SIMUFORM 34 (Prosthesis & Implant Heart) IMPLANT
SET MPS 3-ND DEL (MISCELLANEOUS) IMPLANT
SIZER CHORD-X CHORDAL CXCS (SIZER) IMPLANT
SPONGE T-LAP 18X18 ~~LOC~~+RFID (SPONGE) ×4 IMPLANT
SPONGE T-LAP 4X18 ~~LOC~~+RFID (SPONGE) ×1 IMPLANT
SUT ETHIBOND 2 0 SH (SUTURE) IMPLANT
SUT ETHIBOND 2 0 SH 36X2 (SUTURE) ×2 IMPLANT
SUT ETHIBOND 2 0 V4 (SUTURE) IMPLANT
SUT ETHIBOND 2 0V4 GREEN (SUTURE) IMPLANT
SUT ETHIBOND 4 0 RB 1 (SUTURE) IMPLANT
SUT GORETEX CV-5 PH-17 (SUTURE) IMPLANT
SUT PROLENE 3 0 SH1 36 (SUTURE) ×1 IMPLANT
SUT PROLENE 4 0 SH DA (SUTURE) IMPLANT
SUT PROLENE 4-0 RB1 .5 CRCL 36 (SUTURE) ×2 IMPLANT
SUT PROLENE 5 0 C 1 36 (SUTURE) ×2 IMPLANT
SUT PROLENE 5 0 CC1 (SUTURE) ×1 IMPLANT
SUT PTFE CHORD X 16MM (SUTURE) IMPLANT
SUT SILK 1 MH (SUTURE) ×2 IMPLANT
SUT SILK 1 TIES 10X30 (SUTURE) ×1 IMPLANT
SUT SILK 2 0 SH CR/8 (SUTURE) ×2 IMPLANT
SUT SILK 2-0 18XBRD TIE 12 (SUTURE) ×1 IMPLANT
SUT SILK 3 0 SH CR/8 (SUTURE) ×1 IMPLANT
SUT SILK 4-0 18XBRD TIE 12 (SUTURE) ×1 IMPLANT
SUT STEEL 6MS V (SUTURE) IMPLANT
SUT TEM PAC WIRE 2 0 SH (SUTURE) ×4 IMPLANT
SUT VIC AB 1 CTX36XBRD ANBCTR (SUTURE) IMPLANT
SUT VIC AB 2-0 CTX 27 (SUTURE) IMPLANT
SUT VIC AB 3-0 X1 27 (SUTURE) IMPLANT
SUTURE EB EXC GRN/WHT 2-0 D/A (SUTURE) ×1 IMPLANT
SYR BULB IRRIG 60ML STRL (SYRINGE) IMPLANT
SYSTEM SAHARA CHEST DRAIN ATS (WOUND CARE) ×1 IMPLANT
TAPE CLOTH SURG 4X10 WHT LF (GAUZE/BANDAGES/DRESSINGS) IMPLANT
TAPE PAPER 2X10 WHT MICROPORE (GAUZE/BANDAGES/DRESSINGS) IMPLANT
TOWEL GREEN STERILE (TOWEL DISPOSABLE) ×1 IMPLANT
TOWEL GREEN STERILE FF (TOWEL DISPOSABLE) ×1 IMPLANT
TRAY FOLEY SLVR 16FR TEMP STAT (SET/KITS/TRAYS/PACK) ×1 IMPLANT
TUBE SUCT INTRACARD DLP 20F (MISCELLANEOUS) ×1 IMPLANT
UNDERPAD 30X36 HEAVY ABSORB (UNDERPADS AND DIAPERS) ×1 IMPLANT
WATER STERILE IRR 1000ML POUR (IV SOLUTION) ×2 IMPLANT

## 2023-05-23 NOTE — Progress Notes (Signed)
      [address]       Calvin Drake [address]             [phone]      S/p Mitral repair  Intubated, sedated  BP 102/72   Pulse 88   Temp (!) 97.5 F (36.4 C)   Resp 19   Ht 5\' 7"  (1.702 m)   Wt 84.1 kg   SpO2 97%   BMI 29.05 kg/m  27/14, CI 1.2! Co-ox 66   Intake/Output Summary (Last 24 hours) at 05/23/2023 1750 Last data filed at 05/23/2023 1700 Gross per 24 hour  Intake 7841.95 ml  Output 2500 ml  Net 5341.95 ml   CT ~ 450 ml since OR, 50 ml in last hours after PLT given  Well perfused on exam  Unclear why CI so low with all other parameters indicating good cardiac function.  The thermistor is reading an injectate temp of 27.7 C which is > 88F, will change that thermistor.  For now will use co-ox which indicates good perfusion.  Wean vent  Landon Pinion C. Luna Salinas, MD [facility] [phone]

## 2023-05-23 NOTE — Transfer of Care (Signed)
 Immediate Anesthesia Transfer of Care Note  Patient: Calvin Drake  Procedure(s) Performed: REPAIR, MITRAL VALVE USING SIMUFORM SEMI-RIGID ANNULOPLASTY RING , CLOSURE OF PATENT FORAMEN OVALE ECHOCARDIOGRAM, TRANSESOPHAGEAL, INTRAOPERATIVE  Patient Location: ICU  Anesthesia Type:General  Level of Consciousness: Patient remains intubated per anesthesia plan  Airway & Oxygen Therapy: Patient remains intubated per anesthesia plan and Patient placed on Ventilator (see vital sign flow sheet for setting)  Post-op Assessment: Report given to RN and Post -op Vital signs reviewed and stable  Post vital signs: Reviewed and stable  Last Vitals:  Vitals Value Taken Time  BP 103/67 05/23/23  1347  Temp 35.9 C 05/23/23 1347  Pulse 89 05/23/23 1347  Resp 16 05/23/23 1347  SpO2 96 % 05/23/23 1347  Vitals shown include unfiled device data.  Last Pain:  Vitals:   05/23/23 0650  TempSrc:   PainSc: 0-No pain      Patients Stated Pain Goal: 0 (05/17/23 2148)  Complications: No notable events documented.

## 2023-05-23 NOTE — Plan of Care (Signed)

## 2023-05-23 NOTE — Progress Notes (Addendum)
 Advanced Heart Failure Rounding Note  Cardiologist: Cody Das, MD  Chief Complaint: S/P MVR Subjective:   Admitted to ICU  Currently on Epi 5 mcg, Milrinone  0.25 mcg, Vaso 0.04. NE 2  Intubated/Sedated.  Objective:   Weight Range: 84.1 kg Body mass index is 29.05 kg/m.   Vital Signs:   Temp:  [98.1 F (36.7 C)-99.2 F (37.3 C)] 98.9 F (37.2 C) (05/05 0632) Pulse Rate:  [116-124] 116 (05/05 0632) Resp:  [18-24] 18 (05/05 7829) BP: (101-127)/(66-82) 113/82 (05/05 5621) SpO2:  [91 %-97 %] 92 % (05/05 0632) FiO2 (%):  [50 %] 50 % (05/05 1334) Weight:  [83.9 kg-84.1 kg] 84.1 kg (05/05 0632) Last BM Date : 05/19/23  Weight change: Filed Weights   05/22/23 2200 05/23/23 0544 05/23/23 3086  Weight: 83.9 kg 84.1 kg 84.1 kg    Intake/Output:   Intake/Output Summary (Last 24 hours) at 05/23/2023 1356 Last data filed at 05/23/2023 1349 Gross per 24 hour  Intake 4411 ml  Output 2750 ml  Net 1661 ml      Physical Exam   General:   Intubated/sedated Neck: supple. Difficult to assess Cor: PMI nondisplaced. Regular rate & rhythm. No rubs, gallops or murmurs.  MT x2 Lungs: clear Abdomen: soft, nontender, nondistended.  Extremities: no cyanosis, clubbing, rash, edema Neuro: Intubated/Sedated   Telemetry   A Paced   EKG     Labs    CBC Recent Labs    05/23/23 0014 05/23/23 0758 05/23/23 1137 05/23/23 1237 05/23/23 1243  WBC 13.5*  --   --   --   --   HGB 17.3*   < > 9.7* 10.5* 11.2*  HCT 50.7   < > 29.1* 31.0* 33.0*  MCV 84.4  --   --   --   --   PLT 224  --  95*  --   --    < > = values in this interval not displayed.   Basic Metabolic Panel Recent Labs    57/84/69 0852 05/23/23 0014 05/23/23 0758 05/23/23 1130 05/23/23 1237 05/23/23 1243  NA 128* 126*   < > 134* 134* 134*  K 3.5 3.4*   < > 3.5 3.4* 3.4*  CL 90* 89*   < > 100  --  103  CO2 23 23  --   --   --   --   GLUCOSE 146* 155*   < > 124*  --  162*  BUN 34* 34*   < > 25*  --   25*  CREATININE 1.13 1.08   < > 0.80  --  0.80  CALCIUM  9.9 9.6  --   --   --   --    < > = values in this interval not displayed.   Liver Function Tests Recent Labs    05/23/23 0014  AST 48*  ALT 42  ALKPHOS 138*  BILITOT 1.2  PROT 8.3*  ALBUMIN 3.4*   No results for input(s): "LIPASE", "AMYLASE" in the last 72 hours. Cardiac Enzymes No results for input(s): "CKTOTAL", "CKMB", "CKMBINDEX", "TROPONINI" in the last 72 hours.  BNP: BNP (last 3 results) Recent Labs    04/18/23 1618  BNP 566.1*    ProBNP (last 3 results) No results for input(s): "PROBNP" in the last 8760 hours.   D-Dimer No results for input(s): "DDIMER" in the last 72 hours. Hemoglobin A1C Recent Labs    05/22/23 2249  HGBA1C 5.9*   Fasting Lipid Panel No results  for input(s): "CHOL", "HDL", "LDLCALC", "TRIG", "CHOLHDL", "LDLDIRECT" in the last 72 hours. Thyroid Function Tests No results for input(s): "TSH", "T4TOTAL", "T3FREE", "THYROIDAB" in the last 72 hours.  Invalid input(s): "FREET3"  Other results:   Imaging    No results found.   Medications:     Scheduled Medications:  [START ON 05/24/2023] acetaminophen   1,000 mg Oral Q6H   Or   [START ON 05/24/2023] acetaminophen  (TYLENOL ) oral liquid 160 mg/5 mL  1,000 mg Per Tube Q6H   acetaminophen  (TYLENOL ) oral liquid 160 mg/5 mL  650 mg Per Tube Once   aspirin   324 mg Oral Once   [START ON 05/24/2023] aspirin  EC  325 mg Oral Daily   Or   [START ON 05/24/2023] aspirin   324 mg Per Tube Daily   [START ON 05/24/2023] bisacodyl  10 mg Oral Daily   Or   [START ON 05/24/2023] bisacodyl  10 mg Rectal Daily   chlorhexidine  15 mL Mouth/Throat NOW   cyclobenzaprine   5 mg Oral QHS   [START ON 05/24/2023] docusate sodium  200 mg Oral Daily   [START ON 05/24/2023] melatonin  3 mg Oral QHS   metoCLOPramide (REGLAN) injection  10 mg Intravenous Q6H   metoprolol tartrate  12.5 mg Oral BID   Or   metoprolol tartrate  12.5 mg Per Tube BID   [START ON  05/25/2023] pantoprazole  40 mg Oral Daily   pantoprazole (PROTONIX) IV  40 mg Intravenous QHS   rosuvastatin   40 mg Oral Daily   [START ON 05/24/2023] sodium chloride  flush  3 mL Intravenous Q12H    Infusions:  sodium chloride      [START ON 05/24/2023] sodium chloride      sodium chloride      albumin human      ceFAZolin  (ANCEF ) IV     dexmedetomidine  (PRECEDEX ) IV infusion 0.7 mcg/kg/hr (05/23/23 1330)   epinephrine  5 mcg/min (05/23/23 1330)   insulin  4.2 Units/hr (05/23/23 1330)   lactated ringers     lactated ringers     magnesium  sulfate     milrinone      nitroGLYCERIN      norepinephrine      norepinephrine  (LEVOPHED ) Adult infusion 2 mcg/min (05/23/23 1349)   phenylephrine  (NEO-SYNEPHRINE) Adult infusion     potassium chloride      vancomycin      vasopressin      PRN Medications: sodium chloride , albumin human, dextrose , metoprolol tartrate, midazolam , morphine injection, norepinephrine , ondansetron  (ZOFRAN ) IV, oxyCODONE, [START ON 05/24/2023] sodium chloride  flush, traMADol    Patient Profile  63 y.o. male with history of severe MR. Admitted for workup/management after TEE and R/LHC. 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 Intra Op Echo EF 30-35%. Plan on repeated ECHO after pressors off.  Remains intubated. Currently on Vaso 0.04 units, Milrinone  0.25 mcg, and Epi 8mcg. Maps remains soft. Adding Norepi. Getting Albumin  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  Echo 04/22/23: EF 60-65%, grade II DD, septum flattened in systole and diastole c/w RV pressure and volume overload, RVSP 55 mmHg, severe BAE, severe MR Echo  -TEE 05/16/23: EF 50-55%. RV severely reduced. 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  -Repeat RHC 05/20/23- low filling pressures; RA 1, PCWP 14-16. Cardiac index remains reduced by TD despite addition of milrinone  0.25mcg/kg/min - See above.  Plan to wean pressors in step wise manner.   3. CAD Cath with  nonobstructive CAD.  Continue statin.    Length of Stay: 7  Amy Clegg, NP  05/23/2023, 1:56 PM  Advanced Heart Failure Team Pager [phone] (M-F; 7a - 5p)  Please contact [facility] for night-coverage after hours (5p -7a ) and weekends on [email or web address] CRITICAL CARE Performed by: Nieves Bars   Total critical care time: 10 minutes  Critical care time was exclusive of separately billable procedures and treating other patients.  Critical care was necessary to treat or prevent imminent or life-threatening deterioration.  Critical care was time spent personally by me on the following activities: development of treatment plan with patient and/or surrogate as well as nursing, discussions with consultants, evaluation of patient's response to treatment, examination of patient, obtaining history from patient or surrogate, ordering and performing treatments and interventions, ordering and review of laboratory studies, ordering and review of radiographic studies, pulse oximetry and re-evaluation of patient's condition.  Patient seen with NP, I formulated the plan and agree with the above note.   Patient had MV repair today with PFO closure.  Post-op, the LV looked a bit worse with correction of MR (EF 30% by TEE).  He is currently on epinephrine  5, milrinone  0.25, vasopressin 0.04, and NE 2.  He is in NSR in 80s post-op.   General: sedated on vent Neck: No JVD, no thyromegaly or thyroid nodule.  Lungs: Decreased BS.  CV: Nondisplaced PMI.  Heart regular S1/S2, no S3/S4, no murmur.  No peripheral edema.  Abdomen: Soft, nontender, no hepatosplenomegaly, no distention.  Skin: Intact without lesions or rashes.  Neurologic: Sedated Extremities: No clubbing or cyanosis.  HEENT: Normal.   Now s/p MV repair with annuloplasty ring and PFO closure. EF looks lower now that MR corrected and LV afterload is higher.  - Continue current  milrinone  and pressors, wean pressors as able.  - Assess for diuresis needs tomorrow.   CRITICAL CARE Performed by: Peder Bourdon  Total critical care time: 35 minutes  Critical care time was exclusive of separately billable procedures and treating other patients.  Critical care was necessary to treat or prevent imminent or life-threatening deterioration.  Critical care was time spent personally by me on the following activities: development of treatment plan with patient and/or surrogate as well as nursing, discussions with consultants, evaluation of patient's response to treatment, examination of patient, obtaining history from patient or surrogate, ordering and performing treatments and interventions, ordering and review of laboratory studies, ordering and review of radiographic studies, pulse oximetry and re-evaluation of patient's condition.  Peder Bourdon 05/23/2023 2:27 PM

## 2023-05-23 NOTE — Anesthesia Procedure Notes (Signed)
 Procedure Name: Intubation Date/Time: 05/23/2023 7:56 AM  Performed by: Bennett Brass, CRNAPre-anesthesia Checklist: Patient identified, Emergency Drugs available, Suction available and Patient being monitored Patient Re-evaluated:Patient Re-evaluated prior to induction Oxygen Delivery Method: Circle system utilized Preoxygenation: Pre-oxygenation with 100% oxygen Induction Type: IV induction Ventilation: Mask ventilation without difficulty Laryngoscope Size: Glidescope and 3 Grade View: Grade I Tube type: Oral Tube size: 8.0 mm Number of attempts: 2 Airway Equipment and Method: Stylet and Oral airway Placement Confirmation: ETT inserted through vocal cords under direct vision, positive ETCO2 and breath sounds checked- equal and bilateral Secured at: 22 cm Tube secured with: Tape Dental Injury: Teeth and Oropharynx as per pre-operative assessment  Comments: Attempted with Cleavon Curls III view

## 2023-05-23 NOTE — Plan of Care (Signed)
  Problem: Education: Goal: Understanding of CV disease, CV risk reduction, and recovery process will improve Outcome: Progressing   Problem: Activity: Goal: Ability to return to baseline activity level will improve Outcome: Progressing   Problem: Cardiovascular: Goal: Ability to achieve and maintain adequate cardiovascular perfusion will improve Outcome: Progressing   Problem: Health Behavior/Discharge Planning: Goal: Ability to safely manage health-related needs after discharge will improve Outcome: Progressing   Problem: Education: Goal: Knowledge of General Education information will improve Description: Including pain rating scale, medication(s)/side effects and non-pharmacologic comfort measures Outcome: Progressing   Problem: Health Behavior/Discharge Planning: Goal: Ability to manage health-related needs will improve Outcome: Progressing   Problem: Clinical Measurements: Goal: Ability to maintain clinical measurements within normal limits will improve Outcome: Progressing Goal: Diagnostic test results will improve Outcome: Progressing Goal: Respiratory complications will improve Outcome: Progressing Goal: Cardiovascular complication will be avoided Outcome: Progressing

## 2023-05-23 NOTE — Hospital Course (Addendum)
 HPI: Mr Calvin Drake is a 63 yo man that presents with exertional shortness of breath and fatigue. He had no prior cardiac history until early this year. He initially became ill around the first of the year. He felt short of breath and fatigue with minimal activities. It was originally felt to be a respiratory infection although he never had a fever or productive cough. The patient  "stayed in bed for 3 weeks" before his wife made him go to the doctor and he was found to be in heart failure.     The patient continues to have dyspnea with even minimal exertion and fatigue and he is unable to sleep at night.   Echocardiogram on 05/02 showed severe mitral valve regurgitation.  Cardiac catheterization on 05/02 showed moderate CAD and markedly elevated PA and right heart pressures. He was admitted to [facility] for diuresis and further heart failure optimization.  Dr. Luna Salinas reviewed the patient's diagnostic studies and determined he would benefit from surgical intervention. He reviewed the patient's treatment options as well as the risks and benefits of surgery with the patient. [name] was agreeable to proceed with surgery.  Hospital Course: [name] remained stable and was medically optimized when he was brought to the operating room on 05/23/23. He underwent mitral valve repair utilizing a 32mm simuform semi-rigid annuloplasty ring and closure of a patent foramen ovale. He tolerated the procedure well and was transferred to the SICU in stable condition. He was extubated the evening of surgery without complication. He was on Vasopressin , Norepinephrine , Milrinone  and Epinephrine  for hemodynamic support. Pressors were weaned as hemodynamics tolerated by POD3, he remained on Milrinone . He had expected postoperative acute blood loss anemia, he was transfused with 1U PRBCs. He was routinely diuresed. Chest tubes were removed on POD1 without complication. He had HFpEF and was greater than 20 lbs  above his preoperative weight, he was started on a Lasix  drip per the advanced heart failure team. PICC was placed and swan ganz catheter was removed on POD2. Lopressor  was started. Echo on 05/08 showed EF of 45-50%, Grade 2 Diastolic Dysfunction, and RV was normal.  His lasix  drip was discontinued due to elevation in creatinine to 2.6 consistent with AKI.  Hemodynamic support with milrinone  was continued.  Diet and activity were advanced as tolerated.  He had return of bowel function.  SCDs were used for DVT prophylaxis instead of enoxaparin due to thrombocytopenia.  Creatinine gradually improved.  The metoprolol  was discontinued on postop day 6 by the advanced heart failure team.

## 2023-05-23 NOTE — Progress Notes (Signed)
  Echocardiogram Echocardiogram Transesophageal has been performed.  Royden Corin 05/23/2023, 8:14 AM

## 2023-05-23 NOTE — Anesthesia Postprocedure Evaluation (Signed)
 Anesthesia Post Note  Patient: Calvin Drake  Procedure(s) Performed: REPAIR, MITRAL VALVE USING SIMUFORM SEMI-RIGID ANNULOPLASTY RING , CLOSURE OF PATENT FORAMEN OVALE ECHOCARDIOGRAM, TRANSESOPHAGEAL, INTRAOPERATIVE     Patient location during evaluation: SICU Anesthesia Type: General Level of consciousness: sedated Pain management: pain level controlled Vital Signs Assessment: post-procedure vital signs reviewed and stable Respiratory status: patient remains intubated per anesthesia plan Cardiovascular status: stable Postop Assessment: no apparent nausea or vomiting Anesthetic complications: no   No notable events documented.  Last Vitals:  Vitals:   05/23/23 0544 05/23/23 0632  BP: 103/68 113/82  Pulse:  (!) 116  Resp:  18  Temp:  37.2 C  SpO2:  92%    Last Pain:  Vitals:   05/23/23 0650  TempSrc:   PainSc: 0-No pain                 Calvin Drake

## 2023-05-23 NOTE — Anesthesia Procedure Notes (Signed)
 Central Venous Catheter Insertion Performed by: Lethaniel Rave, MD, anesthesiologist Start/End5/05/2023 6:50 AM, 05/23/2023 7:05 AM Patient location: Pre-op. Preanesthetic checklist: patient identified, IV checked, site marked, risks and benefits discussed, surgical consent, monitors and equipment checked, pre-op evaluation, timeout performed and anesthesia consent Lidocaine  1% used for infiltration and patient sedated Hand hygiene performed  and maximum sterile barriers used  Catheter size: 8.5 Fr PA cath was placed.MAC introducer Swan type:thermodilution Procedure performed using ultrasound guided technique. Ultrasound Notes:anatomy identified, needle tip was noted to be adjacent to the nerve/plexus identified, no ultrasound evidence of intravascular and/or intraneural injection and image(s) printed for medical record Attempts: 1 Following insertion, line sutured and dressing applied. Post procedure assessment: blood return through all ports, free fluid flow and no air  Patient tolerated the procedure well with no immediate complications.

## 2023-05-23 NOTE — Brief Op Note (Addendum)
 05/16/2023 - 05/23/2023  2:15 PM  PATIENT:  Calvin Drake  63 y.o. male  PRE-OPERATIVE DIAGNOSIS:  SEVERE MITRAL REGURGITATION  POST-OPERATIVE DIAGNOSIS:  SEVERE MITRAL REGURGITATION  PROCEDURE:  MITRAL VALVE REPAIR USING SIMUFORM SEMI-RIGID ANNULOPLASTY RING CLOSURE OF PATENT FORAMEN OVALE  INTRAOPERATIVE TRANSESOPHAGEAL ECHOCARDIOGRAM  SURGEON:  Surgeons and Role:    Zelphia Higashi, MD - Primary  PHYSICIAN ASSISTANT: Debroah Fanning PA-C  ASSISTANTS: Nova Began RNFA   ANESTHESIA:   general  EBL:  800 mL   BLOOD ADMINISTERED:none  DRAINS:  Mediastinal and pleural drains    LOCAL MEDICATIONS USED:  NONE  SPECIMEN:  Source of Specimen:  Mitral valve leaflets  DISPOSITION OF SPECIMEN:  PATHOLOGY  COUNTS:  YES  DICTATION: .Dragon Dictation  PLAN OF CARE: Admit to inpatient   PATIENT DISPOSITION:  ICU - intubated and hemodynamically stable.   Delay start of Pharmacological VTE agent (>24hrs) due to surgical blood loss or risk of bleeding: yes

## 2023-05-23 NOTE — Procedures (Signed)
 Extubation Procedure Note  Patient Details:   Name: Calvin Drake DOB: 1960/02/21 MRN: [id number]   Airway Documentation:    Vent end date: 05/23/23 Vent end time: 1853   Evaluation  O2 sats: stable throughout Complications: No apparent complications Patient did tolerate procedure well. Bilateral Breath Sounds: Rhonchi, Diminished   Yes  Oliva Montecalvo 05/23/2023, 6:53 PM NIF- -35cmH2o on 3 attempts, VC 700mL x 3 attempts Extubated to 5L  at this time.

## 2023-05-23 NOTE — Interval H&P Note (Signed)
 History and Physical Interval Note:  05/23/2023 7:14 AM  Calvin Drake  has presented today for surgery, with the diagnosis of SEVERE MR.  The various methods of treatment have been discussed with the patient and family. After consideration of risks, benefits and other options for treatment, the patient has consented to  Procedure(s): REPAIR, MITRAL VALVE (N/A) REPLACEMENT, MITRAL VALVE (N/A) ECHOCARDIOGRAM, TRANSESOPHAGEAL, INTRAOPERATIVE (N/A) as a surgical intervention.  The patient's history has been reviewed, patient examined, no change in status, stable for surgery.  I have reviewed the patient's chart and labs.  Questions were answered to the patient's satisfaction.     Zelphia Higashi

## 2023-05-23 NOTE — Anesthesia Procedure Notes (Signed)
 Arterial Line Insertion Start/End5/05/2023 6:50 AM, 05/23/2023 7:00 AM Performed by: Lethaniel Rave, MD, Bennett Brass, CRNA  Patient location: Pre-op. Preanesthetic checklist: patient identified, IV checked, site marked, risks and benefits discussed, surgical consent, monitors and equipment checked, pre-op evaluation, timeout performed and anesthesia consent Lidocaine  1% used for infiltration Left, radial was placed Catheter size: 20 G Hand hygiene performed  and maximum sterile barriers used   Attempts: 1 Procedure performed using ultrasound guided technique. Ultrasound Notes:anatomy identified, needle tip was noted to be adjacent to the nerve/plexus identified and no ultrasound evidence of intravascular and/or intraneural injection Following insertion, dressing applied and Biopatch. Post procedure assessment: normal and unchanged  Patient tolerated the procedure with difficulty.

## 2023-05-24 ENCOUNTER — Other Ambulatory Visit: Payer: Self-pay | Admitting: Cardiology

## 2023-05-24 ENCOUNTER — Inpatient Hospital Stay (HOSPITAL_COMMUNITY): Payer: Self-pay

## 2023-05-24 DIAGNOSIS — Z9889 Other specified postprocedural states: Secondary | ICD-10-CM

## 2023-05-24 LAB — BPAM PLATELET PHERESIS
Blood Product Expiration Date: 202505082359
ISSUE DATE / TIME: 202505051258
Unit Type and Rh: 5100

## 2023-05-24 LAB — BASIC METABOLIC PANEL WITH GFR
Anion gap: 9 (ref 5–15)
Anion gap: 8 (ref 5–15)
BUN: 21 mg/dL (ref 8–23)
BUN: 22 mg/dL (ref 8–23)
CO2: 19 mmol/L — ABNORMAL LOW (ref 22–32)
CO2: 23 mmol/L (ref 22–32)
Calcium: 7.8 mg/dL — ABNORMAL LOW (ref 8.9–10.3)
Calcium: 8.1 mg/dL — ABNORMAL LOW (ref 8.9–10.3)
Chloride: 102 mmol/L (ref 98–111)
Chloride: 101 mmol/L (ref 98–111)
Creatinine, Ser: 1 mg/dL (ref 0.61–1.24)
Creatinine, Ser: 1.08 mg/dL (ref 0.61–1.24)
GFR, Estimated: >60 mL/min (ref >60)
GFR, Estimated: >60 mL/min (ref >60)
Glucose, Bld: 129 mg/dL — ABNORMAL HIGH (ref 70–99)
Glucose, Bld: 105 mg/dL — ABNORMAL HIGH (ref 70–99)
Potassium: 3.8 mmol/L (ref 3.5–5.1)
Potassium: 4.2 mmol/L (ref 3.5–5.1)
Sodium: 130 mmol/L — ABNORMAL LOW (ref 135–145)
Sodium: 132 mmol/L — ABNORMAL LOW (ref 135–145)

## 2023-05-24 LAB — CBC
HCT: 22.5 % — ABNORMAL LOW (ref 39.0–52.0)
HCT: 24.3 % — ABNORMAL LOW (ref 39.0–52.0)
HCT: 23.3 % — ABNORMAL LOW (ref 39.0–52.0)
Hemoglobin: 7.6 g/dL — ABNORMAL LOW (ref 13.0–17.0)
Hemoglobin: 7.9 g/dL — ABNORMAL LOW (ref 13.0–17.0)
Hemoglobin: 7.7 g/dL — ABNORMAL LOW (ref 13.0–17.0)
MCH: 29.6 pg (ref 26.0–34.0)
MCH: 28.2 pg (ref 26.0–34.0)
MCH: 28.8 pg (ref 26.0–34.0)
MCHC: 33.8 g/dL (ref 30.0–36.0)
MCHC: 32.5 g/dL (ref 30.0–36.0)
MCHC: 33 g/dL (ref 30.0–36.0)
MCV: 87.5 fL (ref 80.0–100.0)
MCV: 86.8 fL (ref 80.0–100.0)
MCV: 87.3 fL (ref 80.0–100.0)
Platelets: 93 10*3/uL — ABNORMAL LOW (ref 150–400)
Platelets: 76 10*3/uL — ABNORMAL LOW (ref 150–400)
Platelets: 59 10*3/uL — ABNORMAL LOW (ref 150–400)
RBC: 2.57 MIL/uL — ABNORMAL LOW (ref 4.22–5.81)
RBC: 2.8 MIL/uL — ABNORMAL LOW (ref 4.22–5.81)
RBC: 2.67 MIL/uL — ABNORMAL LOW (ref 4.22–5.81)
RDW: 13.1 % (ref 11.5–15.5)
RDW: 13.7 % (ref 11.5–15.5)
RDW: 14.2 % (ref 11.5–15.5)
WBC: 14.6 10*3/uL — ABNORMAL HIGH (ref 4.0–10.5)
WBC: 18.2 10*3/uL — ABNORMAL HIGH (ref 4.0–10.5)
WBC: 16.8 10*3/uL — ABNORMAL HIGH (ref 4.0–10.5)
nRBC: 0 % (ref 0.0–0.2)
nRBC: 0 % (ref 0.0–0.2)
nRBC: 0 % (ref 0.0–0.2)

## 2023-05-24 LAB — GLUCOSE, CAPILLARY
Glucose-Capillary: 104 mg/dL — ABNORMAL HIGH (ref 70–99)
Glucose-Capillary: 106 mg/dL — ABNORMAL HIGH (ref 70–99)
Glucose-Capillary: 113 mg/dL — ABNORMAL HIGH (ref 70–99)
Glucose-Capillary: 116 mg/dL — ABNORMAL HIGH (ref 70–99)
Glucose-Capillary: 122 mg/dL — ABNORMAL HIGH (ref 70–99)
Glucose-Capillary: 128 mg/dL — ABNORMAL HIGH (ref 70–99)
Glucose-Capillary: 140 mg/dL — ABNORMAL HIGH (ref 70–99)
Glucose-Capillary: 143 mg/dL — ABNORMAL HIGH (ref 70–99)
Glucose-Capillary: 144 mg/dL — ABNORMAL HIGH (ref 70–99)
Glucose-Capillary: 145 mg/dL — ABNORMAL HIGH (ref 70–99)
Glucose-Capillary: 150 mg/dL — ABNORMAL HIGH (ref 70–99)
Glucose-Capillary: 162 mg/dL — ABNORMAL HIGH (ref 70–99)
Glucose-Capillary: 173 mg/dL — ABNORMAL HIGH (ref 70–99)
Glucose-Capillary: 174 mg/dL — ABNORMAL HIGH (ref 70–99)
Glucose-Capillary: 191 mg/dL — ABNORMAL HIGH (ref 70–99)
Glucose-Capillary: 65 mg/dL — ABNORMAL LOW (ref 70–99)
Glucose-Capillary: 78 mg/dL (ref 70–99)
Glucose-Capillary: 90 mg/dL (ref 70–99)
Glucose-Capillary: 91 mg/dL (ref 70–99)
Glucose-Capillary: 94 mg/dL (ref 70–99)

## 2023-05-24 LAB — COOXEMETRY PANEL
Carboxyhemoglobin: 1 % (ref 0.5–1.5)
Carboxyhemoglobin: 1 % (ref 0.5–1.5)
Methemoglobin: <0.7 % (ref 0.0–1.5)
Methemoglobin: <0.7 % (ref 0.0–1.5)
O2 Saturation: 49.2 %
O2 Saturation: 58.2 %
Total hemoglobin: 7.3 g/dL — ABNORMAL LOW (ref 12.0–16.0)
Total hemoglobin: 7.9 g/dL — ABNORMAL LOW (ref 12.0–16.0)

## 2023-05-24 LAB — PREPARE PLATELET PHERESIS: Unit division: 0

## 2023-05-24 LAB — MAGNESIUM
Magnesium: 2.4 mg/dL (ref 1.7–2.4)
Magnesium: 2.3 mg/dL (ref 1.7–2.4)

## 2023-05-24 LAB — PREPARE RBC (CROSSMATCH)

## 2023-05-24 LAB — SURGICAL PATHOLOGY

## 2023-05-24 MED ORDER — SODIUM BICARBONATE 8.4 % IV SOLN
50.0000 meq | Freq: Once | INTRAVENOUS | Status: AC
  Administered 2023-05-24: 50 meq via INTRAVENOUS
  Filled 2023-05-24: qty 50

## 2023-05-24 MED ORDER — FUROSEMIDE 10 MG/ML IJ SOLN
40.0000 mg | Freq: Once | INTRAMUSCULAR | Status: AC
  Administered 2023-05-24: 40 mg via INTRAVENOUS
  Filled 2023-05-24: qty 4

## 2023-05-24 MED ORDER — SODIUM CHLORIDE 0.9% IV SOLUTION: Freq: Once | INTRAVENOUS | Status: AC

## 2023-05-24 MED ORDER — POTASSIUM CHLORIDE 10 MEQ/50ML IV SOLN
10.0000 meq | INTRAVENOUS | Status: AC
  Administered 2023-05-24 (×4): 10 meq via INTRAVENOUS
  Filled 2023-05-24 (×4): qty 50

## 2023-05-24 MED FILL — Heparin Sodium (Porcine) Inj 1000 Unit/ML: INTRAMUSCULAR | Qty: 10 | Status: AC

## 2023-05-24 MED FILL — Sodium Bicarbonate IV Soln 8.4%: INTRAVENOUS | Qty: 50 | Status: AC

## 2023-05-24 MED FILL — Calcium Chloride Inj 10%: INTRAVENOUS | Qty: 10 | Status: AC

## 2023-05-24 MED FILL — Lidocaine HCl Local Preservative Free (PF) Inj 2%: INTRAMUSCULAR | Qty: 14 | Status: AC

## 2023-05-24 MED FILL — Sodium Chloride IV Soln 0.9%: INTRAVENOUS | Qty: 2000 | Status: AC

## 2023-05-24 MED FILL — Electrolyte-R (PH 7.4) Solution: INTRAVENOUS | Qty: 4000 | Status: AC

## 2023-05-24 MED FILL — Heparin Sodium (Porcine) Inj 1000 Unit/ML: Qty: 1000 | Status: AC

## 2023-05-24 MED FILL — Mannitol IV Soln 20%: INTRAVENOUS | Qty: 500 | Status: AC

## 2023-05-24 MED FILL — Heparin Sodium (Porcine) Inj 1000 Unit/ML: INTRAMUSCULAR | Qty: 40 | Status: AC

## 2023-05-24 MED FILL — Potassium Chloride Inj 2 mEq/ML: INTRAVENOUS | Qty: 40 | Status: AC

## 2023-05-24 NOTE — Progress Notes (Signed)
 Getting echo for MVR. Results going to Dr. Filiberto Hug and Dr. Luna Salinas. Completed in 6 weeks.

## 2023-05-24 NOTE — Discharge Instructions (Addendum)
 Discharge Instructions:  1. You may shower, please wash incisions daily with soap and water and keep dry.  If you wish to cover wounds with dressing you may do so but please keep clean and change daily.  No tub baths or swimming until incisions have completely healed.  If your incisions become red or develop any drainage please call our office at [phone]  2. No Driving until cleared by Dr. Audree Bless office and you are no longer using narcotic pain medications  3. Monitor your weight daily.. Please use the same scale and weigh at same time... If you gain 5-10 lbs in 48 hours with associated lower extremity swelling, please contact our office at [phone]  4. Fever of 101.5 for at least 24 hours with no source, please contact our office at [phone]  5. Activity- up as tolerated, please walk at least 3 times per day.  Avoid strenuous activity, no lifting, pushing, or pulling with your arms over 8-10 lbs for a minimum of 6 weeks  6. If any questions or concerns arise, please do not hesitate to contact our office at [phone]  rediabetes: Eating Plan Prediabetes is when your levels of blood sugar, also called glucose, are higher than normal. This can put you at risk for getting type 2 diabetes. When you have prediabetes, making healthy changes can help keep you from getting diabetes. This includes changes in your diet. Work with your health care provider or an expert in healthy eating called a dietitian. They can help you create a healthy eating plan. This plan can help you: Control your blood sugar levels. Improve your cholesterol levels. Manage your blood pressure. What are tips for following this plan? Reading food labels Read food labels to check the amount of fat and sugar in prepackaged foods. Avoid foods that have: Saturated fats. Trans fats. Added sugars. Check food labels for the amount of salt (sodium). Avoid foods that have more than 300 milligrams (mg) of salt per  serving. Limit your salt intake to less than 2,300 mg each day. Shopping Avoid buying pre-made and processed foods. Avoid buying drinks with added sugar. Cooking Cook with olive oil. Do not use: Butter. Lard. Ghee. Bake, broil, grill, steam, or boil foods. Avoid frying. Meal planning  Work with your dietitian to create an eating plan that's right for you. This may include tracking how many calories you take in each day. Use a food diary, notebook, or mobile app to track what you eat at each meal. Consider following a Mediterranean diet. This includes: Eating many servings of fresh fruits and vegetables each day. Eating fish at least twice a week. Eating one serving each day of whole grains, beans, nuts, and seeds. Using olive oil instead of other fats. Limiting alcohol. Limiting red meat. Using nonfat or low-fat dairy products. Consider following a plant-based diet. This means eating mostly: Vegetables and fruit. Grains. Beans. Nuts and seeds. If you have high blood pressure, you may need to limit your salt intake or follow a diet called the DASH eating plan. The DASH eating plan can help lower high blood pressure. Lifestyle Set weight loss goals with help from your health care team. Losing 7% of your body weight is a good goal for most people with prediabetes. Exercise for at least 30 minutes, 5 or more days a week. For support, think about joining a support group or talking with a mental health counselor. Take medicines only as told. What foods are recommended? Fruits Berries. Bananas. Apples. Oranges.  Grapes. Papaya. Mango. Pomegranate. Kiwi. Grapefruit. Cherries. Vegetables Lettuce. Spinach. Peas. Beets. Cauliflower. Cabbage. Broccoli. Carrots. Tomatoes. Squash. Eggplant. Herbs. Peppers. Onions. Cucumbers. Brussels sprouts. Grains Whole grains, such as whole-wheat or whole-grain breads or pasta. Unsweetened oatmeal. Bulgur. Barley. Quinoa. Brown rice. Corn or whole-wheat  flour tortillas or taco shells. Meats and other proteins Seafood. Poultry without skin. Lean cuts of pork and beef. Tofu. Eggs. Nuts. Beans. Dairy Low-fat or fat-free dairy products, such as yogurt, cottage cheese, and cheese. Beverages Water. Tea. Coffee. Sugar-free or diet soda. Seltzer water. Low-fat or nonfat milk. Milk alternatives, such as soy or almond milk. Fats and oils Olive oil. Canola oil. Sunflower oil. Grapeseed oil. Avocado. Walnuts. Sweets and desserts Sugar-free or low-fat pudding. Sugar-free or low-fat ice cream and other frozen treats. Seasonings and condiments Herbs. Salt-free spices. Mustard. Relish. Low-salt, low-sugar ketchup. Low-salt, low-sugar barbecue sauce. Low-fat or fat-free mayonnaise. The items listed above may not be all the foods and drinks you can have. Talk with a dietitian to learn more. What foods are not recommended? Fruits Fruits canned with syrup. Vegetables Canned vegetables. Frozen vegetables with butter or cream sauce. Grains Refined white flour and flour products, such as bread, pasta, snack foods, and cereals. Meats and other proteins Fatty cuts of meat. Poultry with skin. Breaded or fried meat. Processed meats. Dairy Full-fat yogurt, cheese, or milk. Beverages Sweetened drinks, such as iced tea and soda. Fats and oils Butter. Lard. Ghee. Sweets and desserts Baked goods, such as cake, cupcakes, pastries, cookies, and cheesecake. Seasonings and condiments Spice mixes with added salt. Ketchup. Barbecue sauce. Mayonnaise. The items listed above may not be all the foods and drinks you should avoid. Talk with a dietitian to learn more. Where to find more information American Diabetes Association: diabetes.org/food-nutrition This information is not intended to replace advice given to you by your health care provider. Make sure you discuss any questions you have with your health care provider. Document Revised: 08/08/2022 Document Reviewed:  08/08/2022 Elsevier Patient Education  2024 [employer].

## 2023-05-24 NOTE — Plan of Care (Signed)
  Problem: Activity: Goal: Ability to return to baseline activity level will improve Outcome: Progressing   Problem: Cardiovascular: Goal: Ability to achieve and maintain adequate cardiovascular perfusion will improve Outcome: Progressing Goal: Vascular access site(s) Level 0-1 will be maintained Outcome: Progressing   Problem: Education: Goal: Knowledge of General Education information will improve Description: Including pain rating scale, medication(s)/side effects and non-pharmacologic comfort measures Outcome: Progressing   Problem: Activity: Goal: Risk for activity intolerance will decrease Outcome: Progressing   Problem: Nutrition: Goal: Adequate nutrition will be maintained Outcome: Progressing

## 2023-05-24 NOTE — Significant Event (Signed)
 Hypoglycemic Event  CBG: 65  Treatment: 4 oz juice/soda  Symptoms: None  Follow-up CBG: Time: 1117 CBG Result: 91  Possible Reasons for Event: Medication regimen: IV insulin  drip  Comments/MD notified: Nieves Bars, NP    Jacki [place]

## 2023-05-24 NOTE — Progress Notes (Signed)
 EVENING ROUNDS NOTE :     [address]       [place] [address]             [phone]                 1 Day Post-Op Procedure(s) (LRB): REPAIR, MITRAL VALVE USING SIMUFORM SEMI-RIGID ANNULOPLASTY RING , CLOSURE OF PATENT FORAMEN OVALE (N/A) ECHOCARDIOGRAM, TRANSESOPHAGEAL, INTRAOPERATIVE (N/A)   Total Length of Stay:  LOS: 8 days  Events:   No events Stable day.    BP (!) 104/52   Pulse 87   Temp 98.4 F (36.9 C)   Resp 20   Ht 5\' 7"  (1.702 m)   Wt 97.2 kg   SpO2 91%   BMI 33.56 kg/m   PAP: (29-59)/(13-44) 46/24 CVP:  [8 mmHg-38 mmHg] 12 mmHg CO:  [2.6 L/min-4.5 L/min] 4.5 L/min CI:  [1.32 L/min/m2-2.3 L/min/m2] 2.3 L/min/m2  Vent Mode: PSV;CPAP FiO2 (%):  [40 %] 40 % Set Rate:  [4 bmp] 4 bmp Vt Set:  [520 mL] 520 mL PEEP:  [5 cmH20] 5 cmH20 Pressure Support:  [10 cmH20] 10 cmH20   sodium chloride       ceFAZolin  (ANCEF ) IV 2 g (05/24/23 1403)   dexmedetomidine  (PRECEDEX ) IV infusion Stopped (05/23/23 1906)   epinephrine  7 mcg/min (05/24/23 1100)   insulin  10 Units/hr (05/24/23 1524)   milrinone  0.25 mcg/kg/min (05/24/23 1100)   norepinephrine  (LEVOPHED ) Adult infusion 3 mcg/min (05/24/23 1100)   vasopressin 0.03 Units/min (05/24/23 1100)    I/O last 3 completed shifts: In: 10609.5 [P.O.:1380; I.V.:4636.7; Blood:907; IV Piggyback:3685.8] Out: 4200 [Urine:2450; Blood:800; Chest Tube:950]      Latest Ref Rng & Units 05/24/2023   12:19 PM 05/24/2023    5:00 AM 05/23/2023    7:57 PM  CBC  WBC 4.0 - 10.5 K/uL 18.2  14.6    Hemoglobin 13.0 - 17.0 g/dL 7.9  7.6  9.9   Hematocrit 39.0 - 52.0 % 24.3  22.5  29.0   Platelets 150 - 400 K/uL 76  93         Latest Ref Rng & Units 05/24/2023    5:00 AM 05/23/2023    7:57 PM 05/23/2023    7:34 PM  BMP  Glucose 70 - 99 mg/dL 914   782   BUN 8 - 23 mg/dL 21   21   Creatinine 9.56 - 1.24 mg/dL 2.13   0.86   Sodium 578 - 145 mmol/L 130  138  136   Potassium 3.5 - 5.1 mmol/L 3.8  3.5  3.5    Chloride 98 - 111 mmol/L 102   107   CO2 22 - 32 mmol/L 19   20   Calcium  8.9 - 10.3 mg/dL 7.8   8.3     ABG    Component Value Date/Time   PHART 7.375 05/23/2023 1957   PCO2ART 30.3 (L) 05/23/2023 1957   PO2ART 113 (H) 05/23/2023 1957   HCO3 17.7 (L) 05/23/2023 1957   TCO2 19 (L) 05/23/2023 1957   ACIDBASEDEF 7.0 (H) 05/23/2023 1957   O2SAT 58.2 05/24/2023 1238       Starleen Eastern, MD 05/24/2023 5:09 PM

## 2023-05-24 NOTE — Progress Notes (Addendum)
 Advanced Heart Failure Rounding Note  Cardiologist: Cody Das, MD  Chief Complaint: Heart Failure  Subjective:   5/5: S/P MVR . Extubated.   Remains on Vaso 0.03 units, Norepi 6 mcg.Milrinone  0.25 mcg, and Epi 7 mcg.  CO-OX 49%.   Swan  CVP 12-13  PA 38/23  CI 3,46  CI 1.7  Complaining of fatigue.   Objective:   Weight Range: 97.2 kg Body mass index is 33.56 kg/m.   Vital Signs:   Temp:  [95.2 F (35.1 C)-99.3 F (37.4 C)] 99 F (37.2 C) (05/06 0730) Pulse Rate:  [46-102] 99 (05/06 0730) Resp:  [13-28] 19 (05/06 0730) BP: (75-102)/(41-72) 102/72 (05/05 1700) SpO2:  [66 %-100 %] 98 % (05/06 0730) Arterial Line BP: (67-132)/(40-73) 132/57 (05/06 0730) FiO2 (%):  [40 %-50 %] 40 % (05/05 1834) Weight:  [97.2 kg] 97.2 kg (05/06 0500) Last BM Date : 05/19/23  Weight change: Filed Weights   05/23/23 0544 05/23/23 0632 05/24/23 0500  Weight: 84.1 kg 84.1 kg 97.2 kg    Intake/Output:   Intake/Output Summary (Last 24 hours) at 05/24/2023 0800 Last data filed at 05/24/2023 0600 Gross per 24 hour  Intake 9164.75 ml  Output 3850 ml  Net 5314.75 ml     CVP 12-13  Physical Exam   General:  Appears weak. No resp difficulty Neck: supple. JVP difficult to assess.  Cor: PMI nondisplaced. Regular rate & rhythm. No rubs, gallops or murmurs. MT x2 Lungs: clear on 4 liters [place].  Abdomen: soft, nontender, nondistended.  Extremities: no cyanosis, clubbing, rash, R and LLE 1+ edema Neuro: alert & oriented x3 GU: Foley   Telemetry   SR 90s   EKG    N/A   Labs    CBC Recent Labs    05/23/23 1934 05/23/23 1957 05/24/23 0500  WBC 16.8*  --  14.6*  HGB 10.1* 9.9* 7.6*  HCT 30.8* 29.0* 22.5*  MCV 88.5  --  87.5  PLT 136*  --  93*   Basic Metabolic Panel Recent Labs    21/30/86 1934 05/23/23 1957 05/24/23 0500  NA 136 138 130*  K 3.5 3.5 3.8  CL 107  --  102  CO2 20*  --  19*  GLUCOSE 155*  --  129*  BUN 21  --  21  CREATININE 0.99  --   1.00  CALCIUM  8.3*  --  7.8*  MG 3.0*  --  2.4   Liver Function Tests Recent Labs    05/23/23 0014  AST 48*  ALT 42  ALKPHOS 138*  BILITOT 1.2  PROT 8.3*  ALBUMIN 3.4*   No results for input(s): "LIPASE", "AMYLASE" in the last 72 hours. Cardiac Enzymes No results for input(s): "CKTOTAL", "CKMB", "CKMBINDEX", "TROPONINI" in the last 72 hours.  BNP: BNP (last 3 results) Recent Labs    04/18/23 1618  BNP 566.1*    ProBNP (last 3 results) No results for input(s): "PROBNP" in the last 8760 hours.   D-Dimer No results for input(s): "DDIMER" in the last 72 hours. Hemoglobin A1C Recent Labs    05/22/23 2249  HGBA1C 5.9*   Fasting Lipid Panel No results for input(s): "CHOL", "HDL", "LDLCALC", "TRIG", "CHOLHDL", "LDLDIRECT" in the last 72 hours. Thyroid Function Tests No results for input(s): "TSH", "T4TOTAL", "T3FREE", "THYROIDAB" in the last 72 hours.  Invalid input(s): "FREET3"  Other results:   Imaging    DG Chest Port 1 View Result Date: 05/23/2023 CLINICAL DATA:  [id number] S/P  MVR (mitral valve repair) [id number]. EXAM: PORTABLE CHEST 1 VIEW COMPARISON:  05/21/2023. FINDINGS: Low lung volume. Bilateral lung fields are clear. Bilateral costophrenic angles are clear. Stable cardio-mediastinal silhouette. Mitral annuloplasty noted. Median sternotomy. No acute osseous abnormalities. The soft tissues are within normal limits. Tube/lines: *Endotracheal tube is seen with its tip approximately 3.3 cm above the carina. *Enteric tube is seen with its tip below the left hemidiaphragm overlying the left upper quadrant, overlying the fundus of the stomach region. The side hole is just below the level of left hemidiaphragm. Consider further advancement by 2-4 inches to confidently put the side hole into the stomach. *Right IJ Swan-Ganz sheath noted. *Right IJ Swan-Ganz catheter tip overlies the right main pulmonary outflow tract. *There are at least 2 mediastinal/pericardial drains.  IMPRESSION: *No acute cardiopulmonary abnormality. Support apparatus, as described above. *Tube/lines, as described above. Consider further advancement of enteric tube, as discussed above. Electronically Signed   By: Beula Brunswick M.D.   On: 05/23/2023 14:07     Medications:     Scheduled Medications:  sodium chloride    Intravenous Once   sodium chloride    Intravenous Once   acetaminophen   1,000 mg Oral Q6H   Or   acetaminophen  (TYLENOL ) oral liquid 160 mg/5 mL  1,000 mg Per Tube Q6H   aspirin  EC  325 mg Oral Daily   Or   aspirin   324 mg Per Tube Daily   bisacodyl  10 mg Oral Daily   Or   bisacodyl  10 mg Rectal Daily   Chlorhexidine Gluconate Cloth  6 each Topical Daily   cyclobenzaprine   5 mg Oral QHS   docusate sodium  200 mg Oral Daily   melatonin  3 mg Oral QHS   metoCLOPramide (REGLAN) injection  10 mg Intravenous Q6H   [START ON 05/25/2023] pantoprazole  40 mg Oral Daily   pantoprazole (PROTONIX) IV  40 mg Intravenous QHS   rosuvastatin   40 mg Oral Daily   sodium chloride  flush  3 mL Intravenous Q12H    Infusions:  sodium chloride      sodium chloride      sodium chloride  10 mL/hr at 05/24/23 0500    ceFAZolin  (ANCEF ) IV 2 g (05/24/23 0520)   dexmedetomidine  (PRECEDEX ) IV infusion Stopped (05/23/23 1906)   epinephrine  8 mcg/min (05/24/23 0500)   insulin  5.5 Units/hr (05/24/23 0500)   lactated ringers     lactated ringers 20 mL/hr at 05/24/23 0500   milrinone  0.25 mcg/kg/min (05/24/23 0500)   norepinephrine  (LEVOPHED ) Adult infusion 7 mcg/min (05/24/23 0655)   potassium chloride      vasopressin 0.04 Units/min (05/24/23 0545)    PRN Medications: sodium chloride , dextrose , metoprolol tartrate, midazolam , morphine injection, ondansetron  (ZOFRAN ) IV, oxyCODONE, sodium chloride  flush, traMADol    Patient Profile  63 y.o. male with history of severe MR. Admitted for workup/management after TEE and R/LHC. 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 Intra Op Echo EF 30-35%. Plan to repeated Echo after pressors off.  Currently on Vaso 0.03  units, Norepi 6 mcg.Milrinone  0.25 mcg, and Epi 7 7 mcg. Continue to wean pressors. CI 1.7 CO-OX 49% but hgb low.     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  Echo 04/22/23: EF 60-65%, grade II DD, septum flattened in systole and diastole c/w RV pressure and volume overload, RVSP 55 mmHg, severe BAE, severe MR Echo  -TEE 05/16/23: EF 50-55%. RV severely reduced.  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  -Repeat RHC 05/20/23- low filling pressures; RA 1, PCWP 14-16. Cardiac index remains reduced by TD despite addition of milrinone  0.25mcg/kg/min - See above. Plan to wean pressors in step wise manner.  - CO-OX 49% but hgb low. Repeat at noon.  - Give 40 mg IV lasix  after transfusion. Weight up ~ 20 pounds.  -Renal function stable.    3. Anemia, Expected Blood Loss Hgb 7.6 . Discussed with Dr Luna Salinas. Give 1 UPRBCs. Check CBC after transfusion.   4. CAD Cath with nonobstructive CAD.  Continue statin.     Length of Stay: 8  Amy Clegg, NP  05/24/2023, 8:00 AM CRITICAL CARE Performed by: Nieves Bars   Total critical care time: 11 minutes  Critical care time was exclusive of separately billable procedures and treating other patients.  Critical care was necessary to treat or prevent imminent or life-threatening deterioration.  Critical care was time spent personally by me on the following activities: development of treatment plan with patient and/or surrogate as well as nursing, discussions with consultants, evaluation of patient's response to treatment, examination of patient, obtaining history from patient or surrogate, ordering and performing treatments and interventions, ordering and review of laboratory studies, ordering and review of radiographic studies,  pulse oximetry and re-evaluation of patient's condition.  Advanced Heart Failure Team Pager [phone] (M-F; 7a - 5p)  Please contact [facility] for night-coverage after hours (5p -7a ) and weekends on [email or web address]  Patient seen with NP, I formulated the plan and agree with the above note.   Currently on epinephrine  7, NE 3, milrinone  0.25, vasopressin 0.04.  He is in NSR.  CVP 17 on my read this morning, current CI 1.8 with co-ox 49% in setting of anemia (hgb 7.5).  He has received 1 unit PRBCs.   He is awake/alert, no complaints.   General: NAD Neck: JVP 14 cm, no thyromegaly or thyroid nodule.  Lungs: Clear to auscultation bilaterally with normal respiratory effort. CV: Nondisplaced PMI.  Heart regular S1/S2, no S3/S4, no murmur.  Trace ankle edema.   Abdomen: Soft, nontender, no hepatosplenomegaly, no distention.  Skin: Intact without lesions or rashes.  Neurologic: Alert and oriented x 3.  Psych: Normal affect. Extremities: No clubbing or cyanosis.  HEENT: Normal.   Now s/p MV repair with annuloplasty ring and PFO closure. EF looks lower now that MR corrected and LV afterload is higher (EF 30% range). CVP 17 today, CI 1.8 by thermodilution with co-ox 49% in setting of anemia.  - Gradually weaning pressors.   - Continue milrinone  0.25.  - Has had 1 unit PRBCs this morning, repeat co-ox.  - Volume overloaded, start gentle diuresis with Lasix  40 mg IV x 1 now and follow response.   CRITICAL CARE Performed by: Peder Bourdon  Total critical care time: 35 minutes  Critical care time was exclusive of separately billable procedures and treating other patients.  Critical care was necessary to treat or prevent imminent or life-threatening deterioration.  Critical care was time spent personally by me on the following activities: development of treatment plan with patient and/or surrogate as well as nursing, discussions with consultants, evaluation of patient's response to treatment,  examination of patient, obtaining history from patient or surrogate, ordering and performing treatments and interventions, ordering and review of laboratory studies, ordering and review of radiographic studies, pulse oximetry and re-evaluation of patient's condition.  Peder Bourdon 05/24/2023 10:55  AM

## 2023-05-24 NOTE — Discharge Summary (Signed)
 [address]       [place] [address]             [phone]    Physician Discharge Summary  Patient ID: Calvin Drake MRN: [id number] DOB/AGE: 1960/10/11 62 y.o.  Admit date: 05/16/2023 Discharge date: 06/03/2023  Admission Diagnoses:  Patient Active Problem List   Diagnosis Date Noted   Nonrheumatic mitral valve regurgitation 05/16/2023   Severe mitral regurgitation 04/28/2023   Pre-procedural cardiovascular examination 04/28/2023   Exertional dyspnea 04/18/2023   Orthopnea 04/18/2023   PND (paroxysmal nocturnal dyspnea) 04/18/2023   Discharge Diagnoses:  Patient Active Problem List   Diagnosis Date Noted   S/P MVR (mitral valve repair) 05/23/2023   Nonrheumatic mitral valve regurgitation 05/16/2023   Severe mitral regurgitation 04/28/2023   Pre-procedural cardiovascular examination 04/28/2023   Exertional dyspnea 04/18/2023   Orthopnea 04/18/2023   PND (paroxysmal nocturnal dyspnea) 04/18/2023   Discharged Condition: Stable  HPI: Calvin Drake is a 63 yo man that presents with exertional shortness of breath and fatigue. He had no prior cardiac history until early this year. He initially became ill around the first of the year. He felt short of breath and fatigue with minimal activities. It was originally felt to be a respiratory infection although he never had a fever or productive cough. The patient  "stayed in bed for 3 weeks" before his wife made him go to the doctor and he was found to be in heart failure.     The patient continues to have dyspnea with even minimal exertion and fatigue and he is unable to sleep at night.   Echocardiogram on 05/02 showed severe mitral valve regurgitation.  Cardiac catheterization on 05/02 showed moderate CAD and markedly elevated PA and right heart pressures. He was admitted to [facility] for diuresis and further heart failure optimization.  Dr. Luna Salinas reviewed the patient's diagnostic studies and  determined he would benefit from surgical intervention. He reviewed the patient's treatment options as well as the risks and benefits of surgery with the patient. Calvin Drake was agreeable to proceed with surgery.  Hospital Course: Calvin Drake remained stable and was medically optimized when he was brought to the operating room on 05/23/23. He underwent mitral valve repair utilizing a 32mm simuform semi-rigid annuloplasty ring and closure of a patent foramen ovale. He tolerated the procedure well and was transferred to the SICU in stable condition. He was extubated the evening of surgery without complication. He was on Vasopressin , Norepinephrine , Milrinone  and Epinephrine  for hemodynamic support. Pressors were weaned as hemodynamics tolerated by POD3, he remained on Milrinone . He had expected postoperative acute blood loss anemia, he was transfused with 1U PRBCs. He was routinely diuresed. Chest tubes were removed on POD1 without complication. He had HFpEF and was greater than 20 lbs above his preoperative weight, he was started on a Lasix  drip per the advanced heart failure team. PICC was placed and swan ganz catheter was removed on POD2. Lopressor  was started. Echo on 05/08 showed EF of 45-50%, Grade 2 Diastolic Dysfunction, and RV was normal.  His lasix  drip was discontinued due to elevation in creatinine to 2.6 consistent with AKI, this improved once diuresis was discontinued.  Hemodynamic support with milrinone  was continued.  Diet and activity were advanced as tolerated.  He had return of bowel function.  SCDs were used for DVT prophylaxis instead of enoxaparin due to thrombocytopenia.  Creatinine gradually improved.  The metoprolol  was discontinued on postop day  6 by the advanced heart failure team. Milrinone  was discontinued and tolerated well. He was felt stable for transfer to the progressive unit on POD7. He had AKI post op but this did resolve as last creatinine was down to 1.24. Will discuss if should  initiate GDMT with cardiology.  He was transitioned to oral Lasix  and potassium to facilitate diuresis. Low dose Coreg  was started and tolerated well. He developed leukocytosis without clear sign of infection, PICC line was removed 05/14. Follow up CBC showed WBC to be 17,000. UA was negative for UTI and he had no sign of wound infection. His bowels were moving appropriately and he was ambulating well on room air. His incisions were healing well without sign of infection. Chest tube sutures were removed on 05/15.  He was started on Jardiance  prior to discharge. He was felt stable for discharge home.   Consults: None  Significant Diagnostic Studies:  ECHOCARDIOGRAM LIMITED REPORT    Patient Name:   Calvin Drake Date of Exam: 05/20/2023  Medical Rec #:  [id number]      Height:       67.0 in  Accession #:    [id number]     Weight:       196.4 lb  Date of Birth:  26-Aug-1960      BSA:          2.007 m  Patient Age:    62 years       BP:           109/79 mmHg  Patient Gender: M              HR:           117 bpm.  Exam Location:  Inpatient   Procedure: 2D Echo, Cardiac Doppler and Color Doppler (Both Spectral and  Color            Flow Doppler were utilized during procedure).   Indications:    I50.9* Heart failure (unspecified)    History:        Patient has prior history of Echocardiogram examinations,  most                 recent 04/22/2023.    Sonographer:    Andrena Bang  Referring Phys: [phone] AMY D CLEGG   IMPRESSIONS     1. Left ventricular ejection fraction, by estimation, is 60 to 65%. The  left ventricle has normal function. The left ventricle has no regional  wall motion abnormalities.   2. The RV is not thoroughly visualized, however, right ventricular  systolic function appears at least mildly reduced. The right ventricular  size is mildly enlarged.   3. The mitral valve is abnormal. 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 No evidence of  mitral stenosis.   4. The aortic valve is normal in structure. Aortic valve regurgitation is  not visualized. No aortic stenosis is present.   5. The inferior vena cava is normal in size with greater than 50%  respiratory variability, suggesting right atrial pressure of 3 mmHg.   FINDINGS   Left Ventricle: Left ventricular ejection fraction, by estimation, is 60  to 65%. The left ventricle has normal function. The left ventricle has no  regional wall motion abnormalities. The left ventricular internal cavity  size was normal in size. There is   no left ventricular hypertrophy.   Right Ventricle: The right ventricular size is mildly enlarged. No  increase in right ventricular  wall thickness. Right ventricular systolic  function is mildly reduced.   Left Atrium: Left atrial size was normal in size.   Right Atrium: Right atrial size was normal in size.   Pericardium: There is no evidence of pericardial effusion.   Mitral Valve: The mitral valve is abnormal. Severe mitral valve  regurgitation. No evidence of mitral valve stenosis.   Tricuspid Valve: The tricuspid valve is normal in structure. Tricuspid  valve regurgitation is trivial. No evidence of tricuspid stenosis.   Aortic Valve: The aortic valve is normal in structure. Aortic valve  regurgitation is not visualized. No aortic stenosis is present.   Pulmonic Valve: The pulmonic valve was normal in structure. Pulmonic valve  regurgitation is mild. No evidence of pulmonic stenosis.   Aorta: The aortic root is normal in size and structure.   Venous: The inferior vena cava is normal in size with greater than 50%  respiratory variability, suggesting right atrial pressure of 3 mmHg.   IAS/Shunts: No atrial level shunt detected by color flow Doppler.     LV Volumes (MOD)  LV vol d, MOD A2C: 130.0 ml Diastology  LV vol d, MOD A4C: 169.0 ml LV e' medial:    8.21 cm/s  LV vol s, MOD A2C: 48.7 ml  LV E/e' medial:  13.4  LV vol s,  MOD A4C: 58.9 ml  LV e' lateral:   21.80 cm/s  LV SV MOD A2C:     81.3 ml  LV E/e' lateral: 5.0  LV SV MOD A4C:     169.0 ml  LV SV MOD BP:      98.3 ml   MITRAL VALVE  MV Area (PHT): 5.09 cm  MV Decel Time: 149 msec  MV E velocity: 110.00 cm/s  MV A velocity: 64.00 cm/s  MV E/A ratio:  1.72   Aditya Sabharwal  Electronically signed by Alwin Baars  Signature Date/Time: 05/20/2023/2:55:47 PM      Final      Treatments: surgery: Operative Report    DATE OF PROCEDURE: 05/23/2023   PREOPERATIVE DIAGNOSIS: Severe mitral regurgitation.   POSTOPERATIVE DIAGNOSES: Severe mitral regurgitation and small patent foramen ovale.   PROCEDURE: Median sternotomy, extracorporeal circulation, mitral valve repair using 16-mm Gore-Tex Neochords to P2 and P3 leaflets, closure of cleft between P2 and P3 and 32-mm Medtronic SimuForm annuloplasty ring (model number 7800 RR, serial number  [id number]). Closure of patent foramen ovale.  Discharge Exam: Performed by Ninfa Basques PA-C Blood pressure 108/69, pulse 96, temperature 98.4 F (36.9 C), temperature source Oral, resp. rate 19, height 5\' 7"  (1.702 m), weight 89.1 kg, SpO2 96%. Cardiovascular: RRR Pulmonary: Clear to auscultation bilaterally Abdomen: Soft, non tender, bowel sounds present. Extremities: Compression stockings in place Wound: Clean and dry.  No erythema or signs of infection.   Discharge Medications:  The patient has been discharged on:   1.Beta Blocker:  Yes [ X  ]                              No   [   ]                              If No, reason:  2.Ace Inhibitor/ARB: Yes [   ]  No  [  X  ]                                     If No, reason: Soft BP  3.Statin:   Yes [  X ]                  No  [   ]                  If No, reason:  4.Ecasa:  Yes  [  X ]                  No   [   ]                  If No, reason:  Patient had ACS upon admission: No  Plavix/P2Y12  inhibitor: Yes [   ]                                      No  [ X  ]     Discharge Instructions     Amb Referral to Cardiac Rehabilitation   Complete by: As directed    To [place]   Diagnosis: Valve Repair   Valve: Mitral   After initial evaluation and assessments completed: Virtual Based Care may be provided alone or in conjunction with Phase 2 Cardiac Rehab based on patient barriers.: Yes   Intensive Cardiac Rehabilitation (ICR) [facility] location only OR Traditional Cardiac Rehabilitation (TCR) *If criteria for ICR are not met will enroll in TCR ([facility] only): Yes      Allergies as of 06/02/2023   No Known Allergies      Medication List     STOP taking these medications    amoxicillin 500 MG tablet Commonly known as: AMOXIL       TAKE these medications    aspirin  EC 325 MG tablet Take 325 mg by mouth daily as needed (feeling bad).   carvedilol  3.125 MG tablet Commonly known as: COREG  Take 1 tablet (3.125 mg total) by mouth 2 (two) times daily with a meal.   furosemide  20 MG tablet Commonly known as: LASIX  Take 1 tablet (20 mg total) by mouth daily. What changed: when to take this   Jardiance  10 MG Tabs tablet Generic drug: empagliflozin  Take 1 tablet (10 mg total) by mouth daily.   oxyCODONE  5 MG immediate release tablet Commonly known as: Oxy IR/ROXICODONE  Take 1 tablet (5 mg total) by mouth every 6 (six) hours as needed for severe pain (pain score 7-10).   rosuvastatin  40 MG tablet Commonly known as: CRESTOR  Take 1 tablet (40 mg total) by mouth daily.        Follow-up Information     Zelphia Higashi, MD Follow up on 06/28/2023.   Specialty: Cardiothoracic Surgery Why: Follow up appointment is at 9:45AM. Please get a chest xray at 8:45AM on the 2nd floor. Contact information: [address] [place] [address] [phone]         Carie Charity, NP Follow up on 06/15/2023.   Specialty: Cardiology Why: Cardiology appointment is at  2:20PM Contact information: [address] [place] [address] [phone]         [facility] CV Img Echo at [name] Follow up.   Specialty: Cardiology Why: Echocardiogram is at 1:00PM Contact information: [address] [place]  [address] [phone]        [facility] Follow up.   Why: please schedule hospital follow up appointment to see primary care provider in 7-14 days regarding further surveillance of HGA1C 5.9 (pre diabetes) Contact information: [address] [place] [address] [phone]                 Signed:  Gates Kasal, PA-C 06/03/2023, 9:38 AM

## 2023-05-24 NOTE — Progress Notes (Signed)
 1 Day Post-Op Procedure(s) (LRB): REPAIR, MITRAL VALVE USING SIMUFORM SEMI-RIGID ANNULOPLASTY RING , CLOSURE OF PATENT FORAMEN OVALE (N/A) ECHOCARDIOGRAM, TRANSESOPHAGEAL, INTRAOPERATIVE (N/A) Subjective: Some incisional pain, denies nausea except when given PO K  Objective: Vital signs in last 24 hours: Temp:  [95.2 F (35.1 C)-99.3 F (37.4 C)] 99 F (37.2 C) (05/06 0645) Pulse Rate:  [46-102] 96 (05/06 0645) Cardiac Rhythm: Normal sinus rhythm (05/05 2300) Resp:  [13-28] 18 (05/06 0645) BP: (75-102)/(41-72) 102/72 (05/05 1700) SpO2:  [66 %-100 %] 99 % (05/06 0645) Arterial Line BP: (67-130)/(40-73) 107/53 (05/06 0645) FiO2 (%):  [40 %-50 %] 40 % (05/05 1834) Weight:  [97.2 kg] 97.2 kg (05/06 0500)  Hemodynamic parameters for last 24 hours: PAP: (23-59)/(10-44) 39/21 CVP:  [7 mmHg-53 mmHg] 15 mmHg CO:  [2 L/min-3.5 L/min] 3.5 L/min CI:  [1.1 L/min/m2-1.76 L/min/m2] 1.76 L/min/m2  Intake/Output from previous day: 05/05 0701 - 05/06 0700 In: 9164.8 [P.O.:240; I.V.:4431.9; Blood:907; IV Piggyback:3585.8] Out: 3850 [Urine:2100; Blood:800; Chest Tube:950] Intake/Output this shift: No intake/output data recorded.  General appearance: alert, cooperative, and no distress Neurologic: intact Heart: regular rate and rhythm Lungs: diminished breath sounds bibasilar Abdomen: normal findings: soft, non-tender  Lab Results: Recent Labs    05/23/23 1342 05/23/23 1350 05/23/23 1934 05/23/23 1957  WBC 24.3*  --  16.8*  --   HGB 12.3*   < > 10.1* 9.9*  HCT 37.4*   < > 30.8* 29.0*  PLT 96*  --  136*  --    < > = values in this interval not displayed.   BMET:  Recent Labs    05/23/23 1934 05/23/23 1957 05/24/23 0500  NA 136 138 130*  K 3.5 3.5 3.8  CL 107  --  102  CO2 20*  --  19*  GLUCOSE 155*  --  129*  BUN 21  --  21  CREATININE 0.99  --  1.00  CALCIUM  8.3*  --  7.8*    PT/INR:  Recent Labs    05/23/23 1342  LABPROT 20.8*  INR 1.8*   ABG    Component  Value Date/Time   PHART 7.375 05/23/2023 1957   HCO3 17.7 (L) 05/23/2023 1957   TCO2 19 (L) 05/23/2023 1957   ACIDBASEDEF 7.0 (H) 05/23/2023 1957   O2SAT 49.2 05/24/2023 0532   CBG (last 3)  Recent Labs    05/24/23 0358 05/24/23 0508 05/24/23 0648  GLUCAP 128* 113* 144*    Assessment/Plan: S/P Procedure(s) (LRB): REPAIR, MITRAL VALVE USING SIMUFORM SEMI-RIGID ANNULOPLASTY RING , CLOSURE OF PATENT FORAMEN OVALE (N/A) ECHOCARDIOGRAM, TRANSESOPHAGEAL, INTRAOPERATIVE (N/A) POD # 1 NEURO- intact CV- in SR  CI up to 1.75 but co-ox down to 49 (?)  Will wean pressors as BP tolerates  Continue milrinone   Will keep Swan this AM RESP- IS for atelectasis RENAL- creatinine normal  Weight up - diurese as BP allows ENDO- CBG controlled with insulin  drip, but on relatively high dose  Continue insulin  drip this morning  May be able to transition later today GI- diet as tolerated Anemia- Hgb down to 7.6- will give one unit as he is on mulitple pressors Thrombocytopenia- await CBC result SCD for DVT prophylaxis Hold off on enoxaparin due to bleeding risk Mobilize OOB   LOS: 8 days    Calvin Drake 05/24/2023

## 2023-05-24 NOTE — Progress Notes (Signed)
   Completed 1UPRBC. Hgb 7.6>7.9 MT x2  with 70 cc out since the morning.   On Milrinone  0.25 mcg + Norepi 3 mcg + Epi 7 mcg + Vaso 0.03 units.  CI 2.4   Called Dr Luna Salinas with an update.   Jorgia Manthei NP-C  2:32 PM

## 2023-05-25 ENCOUNTER — Inpatient Hospital Stay (HOSPITAL_COMMUNITY): Payer: Self-pay

## 2023-05-25 ENCOUNTER — Other Ambulatory Visit: Payer: Self-pay

## 2023-05-25 LAB — BASIC METABOLIC PANEL WITH GFR
Anion gap: 7 (ref 5–15)
Anion gap: 12 (ref 5–15)
BUN: 25 mg/dL — ABNORMAL HIGH (ref 8–23)
BUN: 28 mg/dL — ABNORMAL HIGH (ref 8–23)
CO2: 23 mmol/L (ref 22–32)
CO2: 22 mmol/L (ref 22–32)
Calcium: 8.1 mg/dL — ABNORMAL LOW (ref 8.9–10.3)
Calcium: 8 mg/dL — ABNORMAL LOW (ref 8.9–10.3)
Chloride: 100 mmol/L (ref 98–111)
Chloride: 95 mmol/L — ABNORMAL LOW (ref 98–111)
Creatinine, Ser: 1.36 mg/dL — ABNORMAL HIGH (ref 0.61–1.24)
Creatinine, Ser: 1.43 mg/dL — ABNORMAL HIGH (ref 0.61–1.24)
GFR, Estimated: 59 mL/min — ABNORMAL LOW (ref >60)
GFR, Estimated: 56 mL/min — ABNORMAL LOW (ref >60)
Glucose, Bld: 103 mg/dL — ABNORMAL HIGH (ref 70–99)
Glucose, Bld: 189 mg/dL — ABNORMAL HIGH (ref 70–99)
Potassium: 4.6 mmol/L (ref 3.5–5.1)
Potassium: 3.8 mmol/L (ref 3.5–5.1)
Sodium: 130 mmol/L — ABNORMAL LOW (ref 135–145)
Sodium: 129 mmol/L — ABNORMAL LOW (ref 135–145)

## 2023-05-25 LAB — PREPARE RBC (CROSSMATCH)

## 2023-05-25 LAB — CBC
HCT: 21.9 % — ABNORMAL LOW (ref 39.0–52.0)
HCT: 24.8 % — ABNORMAL LOW (ref 39.0–52.0)
Hemoglobin: 7.1 g/dL — ABNORMAL LOW (ref 13.0–17.0)
Hemoglobin: 8.1 g/dL — ABNORMAL LOW (ref 13.0–17.0)
MCH: 28.7 pg (ref 26.0–34.0)
MCH: 28.9 pg (ref 26.0–34.0)
MCHC: 32.4 g/dL (ref 30.0–36.0)
MCHC: 32.7 g/dL (ref 30.0–36.0)
MCV: 88.7 fL (ref 80.0–100.0)
MCV: 88.6 fL (ref 80.0–100.0)
Platelets: 51 K/uL — ABNORMAL LOW (ref 150–400)
Platelets: 59 10*3/uL — ABNORMAL LOW (ref 150–400)
RBC: 2.47 MIL/uL — ABNORMAL LOW (ref 4.22–5.81)
RBC: 2.8 MIL/uL — ABNORMAL LOW (ref 4.22–5.81)
RDW: 14.2 % (ref 11.5–15.5)
RDW: 13.9 % (ref 11.5–15.5)
WBC: 16 K/uL — ABNORMAL HIGH (ref 4.0–10.5)
WBC: 14.4 10*3/uL — ABNORMAL HIGH (ref 4.0–10.5)
nRBC: 0 % (ref 0.0–0.2)
nRBC: 0 % (ref 0.0–0.2)

## 2023-05-25 LAB — GLUCOSE, CAPILLARY
Glucose-Capillary: 100 mg/dL — ABNORMAL HIGH (ref 70–99)
Glucose-Capillary: 106 mg/dL — ABNORMAL HIGH (ref 70–99)
Glucose-Capillary: 107 mg/dL — ABNORMAL HIGH (ref 70–99)
Glucose-Capillary: 108 mg/dL — ABNORMAL HIGH (ref 70–99)
Glucose-Capillary: 112 mg/dL — ABNORMAL HIGH (ref 70–99)
Glucose-Capillary: 118 mg/dL — ABNORMAL HIGH (ref 70–99)
Glucose-Capillary: 122 mg/dL — ABNORMAL HIGH (ref 70–99)
Glucose-Capillary: 128 mg/dL — ABNORMAL HIGH (ref 70–99)
Glucose-Capillary: 156 mg/dL — ABNORMAL HIGH (ref 70–99)
Glucose-Capillary: 158 mg/dL — ABNORMAL HIGH (ref 70–99)
Glucose-Capillary: 208 mg/dL — ABNORMAL HIGH (ref 70–99)
Glucose-Capillary: 213 mg/dL — ABNORMAL HIGH (ref 70–99)
Glucose-Capillary: 92 mg/dL (ref 70–99)
Glucose-Capillary: 99 mg/dL (ref 70–99)

## 2023-05-25 LAB — COOXEMETRY PANEL
Carboxyhemoglobin: 0.9 % (ref 0.5–1.5)
Methemoglobin: <0.7 % (ref 0.0–1.5)
O2 Saturation: 61.7 %
Total hemoglobin: 7.8 g/dL — ABNORMAL LOW (ref 12.0–16.0)

## 2023-05-25 MED ORDER — FUROSEMIDE 10 MG/ML IJ SOLN
80.0000 mg | Freq: Once | INTRAMUSCULAR | Status: AC
  Administered 2023-05-25: 80 mg via INTRAVENOUS
  Filled 2023-05-25: qty 8

## 2023-05-25 MED ORDER — SODIUM CHLORIDE 0.9% FLUSH
10.0000 mL | Freq: Two times a day (BID) | INTRAVENOUS | Status: DC
Start: 2023-05-25 — End: 2023-06-02
  Administered 2023-05-25: 20 mL
  Administered 2023-05-25 – 2023-05-29 (×8): 10 mL
  Administered 2023-05-30: 20 mL
  Administered 2023-05-30 – 2023-05-31 (×2): 10 mL
  Administered 2023-05-31: 30 mL
  Administered 2023-06-01: 10 mL

## 2023-05-25 MED ORDER — FUROSEMIDE 10 MG/ML IJ SOLN: 80.0000 mg | Freq: Two times a day (BID) | INTRAMUSCULAR | Status: DC

## 2023-05-25 MED ORDER — INSULIN ASPART 100 UNIT/ML IJ SOLN
0.0000 [IU] | INTRAMUSCULAR | Status: DC
  Administered 2023-05-25: 2 [IU] via SUBCUTANEOUS
  Administered 2023-05-25: 8 [IU] via SUBCUTANEOUS
  Administered 2023-05-25 (×2): 2 [IU] via SUBCUTANEOUS
  Administered 2023-05-25: 8 [IU] via SUBCUTANEOUS
  Administered 2023-05-26 (×2): 2 [IU] via SUBCUTANEOUS

## 2023-05-25 MED ORDER — FUROSEMIDE 10 MG/ML IJ SOLN
10.0000 mg/h | INTRAVENOUS | Status: DC
  Administered 2023-05-25 – 2023-05-26 (×3): 10 mg/h via INTRAVENOUS
  Filled 2023-05-25 (×4): qty 20

## 2023-05-25 MED ORDER — SODIUM CHLORIDE 0.9% FLUSH: 10.0000 mL | INTRAVENOUS | Status: DC | PRN

## 2023-05-25 MED ORDER — SODIUM CHLORIDE 0.9% IV SOLUTION: Freq: Once | INTRAVENOUS | Status: DC

## 2023-05-25 MED ORDER — INSULIN GLARGINE-YFGN 100 UNIT/ML ~~LOC~~ SOLN
25.0000 [IU] | Freq: Two times a day (BID) | SUBCUTANEOUS | Status: DC
  Administered 2023-05-25 – 2023-05-26 (×4): 25 [IU] via SUBCUTANEOUS
  Filled 2023-05-25 (×6): qty 0.25

## 2023-05-25 MED ORDER — INSULIN GLARGINE-YFGN 100 UNIT/ML ~~LOC~~ SOPN
25.0000 [IU] | PEN_INJECTOR | Freq: Two times a day (BID) | SUBCUTANEOUS | Status: DC
Start: 2023-05-25 — End: 2023-05-25
  Filled 2023-05-25: qty 3

## 2023-05-25 NOTE — Progress Notes (Signed)
 Currently on Vaso 0.02 + Epi 1 mcg + milrinone  0.25 mcg.  Given 1UPRBC. Given 80 mg IV lasix  and started on lasix  drip 10 mg per hour. Made >1 liter urine.    Check CBC  Check BMET     Tashonda Pinkus NP-C  3:39 PM

## 2023-05-25 NOTE — Progress Notes (Signed)
 Patient ID: Calvin Drake, male   DOB: [birth year], 63 y.o.   MRN: [id number]  TCTS Evening Rounds:  Hemodynamically stable on milrinone  0.25, vaso 0.02, epi 1.  Lasix  drip 10 with excellent diuresis.  BMET    Component Value Date/Time   NA 129 (L) 05/25/2023 1533   NA 142 04/18/2023 1618   K 3.8 05/25/2023 1533   CL 95 (L) 05/25/2023 1533   CO2 22 05/25/2023 1533   GLUCOSE 189 (H) 05/25/2023 1533   BUN 28 (H) 05/25/2023 1533   BUN 19 04/18/2023 1618   CREATININE 1.43 (H) 05/25/2023 1533   CALCIUM  8.0 (L) 05/25/2023 1533   EGFR 72 04/18/2023 1618   GFRNONAA 56 (L) 05/25/2023 1533   CBC    Component Value Date/Time   WBC 14.4 (H) 05/25/2023 1533   RBC 2.80 (L) 05/25/2023 1533   HGB 8.1 (L) 05/25/2023 1533   HGB 15.5 04/18/2023 1618   HCT 24.8 (L) 05/25/2023 1533   HCT 44.9 04/18/2023 1618   PLT 59 (L) 05/25/2023 1533   PLT 178 04/18/2023 1618   MCV 88.6 05/25/2023 1533   MCV 94 04/18/2023 1618   MCH 28.9 05/25/2023 1533   MCHC 32.7 05/25/2023 1533   RDW 13.9 05/25/2023 1533   RDW 11.9 04/18/2023 1618

## 2023-05-25 NOTE — Progress Notes (Signed)
 2 Days Post-Op Procedure(s) (LRB): REPAIR, MITRAL VALVE USING SIMUFORM SEMI-RIGID ANNULOPLASTY RING , CLOSURE OF PATENT FORAMEN OVALE (N/A) ECHOCARDIOGRAM, TRANSESOPHAGEAL, INTRAOPERATIVE (N/A) Subjective: Denies pain x with cough, no nausea  Objective: Vital signs in last 24 hours: Temp:  [97.5 F (36.4 C)-98.8 F (37.1 C)] 97.9 F (36.6 C) (05/07 0800) Pulse Rate:  [72-99] 87 (05/07 0800) Cardiac Rhythm: Normal sinus rhythm (05/07 0800) Resp:  [7-28] 12 (05/07 0800) BP: (86-105)/(53-75) 105/58 (05/07 0800) SpO2:  [91 %-100 %] 96 % (05/07 0800) Arterial Line BP: (95-134)/(41-57) 101/51 (05/06 1900) Weight:  [98.9 kg] 98.9 kg (05/07 0500)  Hemodynamic parameters for last 24 hours: PAP: (35-72)/(13-32) 59/20 CVP:  [8 mmHg-25 mmHg] 15 mmHg CO:  [3.6 L/min-7.4 L/min] 5 L/min CI:  [1.8 L/min/m2-3.75 L/min/m2] 2.5 L/min/m2  Intake/Output from previous day: 05/06 0701 - 05/07 0700 In: 1767.1 [P.O.:120; I.V.:932.2; Blood:288; IV Piggyback:426.9] Out: 1320 [Urine:1220; Chest Tube:100] Intake/Output this shift: Total I/O In: 17.3 [I.V.:17.3] Out: -   General appearance: alert, cooperative, and no distress Neurologic: intact Heart: regular rate and rhythm Lungs: diminished breath sounds bibasilar Abdomen: mildly distended, nontender  Lab Results: Recent Labs    05/24/23 1711 05/25/23 0500  WBC 16.8* 16.0*  HGB 7.7* 7.1*  HCT 23.3* 21.9*  PLT 59* 51*   BMET:  Recent Labs    05/24/23 1711 05/25/23 0500  NA 132* 130*  K 4.2 4.6  CL 101 100  CO2 23 23  GLUCOSE 105* 103*  BUN 22 25*  CREATININE 1.08 1.36*  CALCIUM  8.1* 8.1*    PT/INR:  Recent Labs    05/23/23 1342  LABPROT 20.8*  INR 1.8*   ABG    Component Value Date/Time   PHART 7.375 05/23/2023 1957   HCO3 17.7 (L) 05/23/2023 1957   TCO2 19 (L) 05/23/2023 1957   ACIDBASEDEF 7.0 (H) 05/23/2023 1957   O2SAT 61.7 05/25/2023 0510   CBG (last 3)  Recent Labs    05/25/23 0544 05/25/23 0648  05/25/23 0746  GLUCAP 107* 106* 128*    Assessment/Plan: S/P Procedure(s) (LRB): REPAIR, MITRAL VALVE USING SIMUFORM SEMI-RIGID ANNULOPLASTY RING , CLOSURE OF PATENT FORAMEN OVALE (N/A) ECHOCARDIOGRAM, TRANSESOPHAGEAL, INTRAOPERATIVE (N/A) POD # 2 NEURO- intact CV- s/p MV repair  In SR  Co-ox 61 and CI 2.5 on milrinone , epi 3, vasopressin 0.02  ASA only unless develops A fib  Will dc Swan once PICC in place RESP_ continue IS RENAL- creatinine up slightly at 1.36  Diurese ENDO- CBG better  Start Sem Glee + SSI  Dc insulin  drip GI- diet as tolerated Cardiac rehab Anemia- Hgb 7.1, no signs of active bleeding  C/w reequilibration   Transfuse 1 unit Thrombocytopenia- monitor, no bleeding, no enoxaparin   LOS: 9 days    Calvin Drake 05/25/2023

## 2023-05-25 NOTE — Progress Notes (Signed)
 Peripherally Inserted Central Catheter Placement  The IV Nurse has discussed with the patient and/or persons authorized to consent for the patient, the purpose of this procedure and the potential benefits and risks involved with this procedure.  The benefits include less needle sticks, lab draws from the catheter, and the patient may be discharged home with the catheter. Risks include, but not limited to, infection, bleeding, blood clot (thrombus formation), and puncture of an artery; nerve damage and irregular heartbeat and possibility to perform a PICC exchange if needed/ordered by physician.  Alternatives to this procedure were also discussed.  Bard Power PICC patient education guide, fact sheet on infection prevention and patient information card has been provided to patient /or left at bedside.    PICC Placement Documentation  PICC Triple Lumen 05/25/23 Right Basilic 38 cm 1 cm (Active)  Indication for Insertion or Continuance of Line Vasoactive infusions 05/25/23 1133  Exposed Catheter (cm) 1 cm 05/25/23 1133  Site Assessment Clean, Dry, Intact 05/25/23 1133  Lumen #1 Status Flushed;Blood return noted;Saline locked 05/25/23 1133  Lumen #2 Status Flushed;Blood return noted;Saline locked 05/25/23 1133  Lumen #3 Status Flushed;Blood return noted;Saline locked 05/25/23 1133  Dressing Type Transparent 05/25/23 1133  Dressing Status Antimicrobial disc/dressing in place 05/25/23 1133  Line Care Connections checked and tightened 05/25/23 1133  Line Adjustment (NICU/IV Team Only) No 05/25/23 1133  Dressing Intervention New dressing 05/25/23 1133  Dressing Change Due 06/01/23 05/25/23 1133       Racquelle Hyser Ramos 05/25/2023, 11:35 AM

## 2023-05-25 NOTE — Progress Notes (Signed)
 [address]       [place] [address]             [phone]      2 Days Post-Op Procedure(s) (LRB): REPAIR, MITRAL VALVE USING SIMUFORM SEMI-RIGID ANNULOPLASTY RING , CLOSURE OF PATENT FORAMEN OVALE (N/A) ECHOCARDIOGRAM, TRANSESOPHAGEAL, INTRAOPERATIVE (N/A) Subjective: Patient states he feels a lot better this AM than he did yesterday. Sitting up in the chair, denies pain and shortness of breath. Dosing off at times while I was in the room.   Objective: Vital signs in last 24 hours: Temp:  [97.5 F (36.4 C)-99 F (37.2 C)] 97.9 F (36.6 C) (05/07 0726) Pulse Rate:  [72-99] 89 (05/07 0726) Cardiac Rhythm: Normal sinus rhythm (05/06 2000) Resp:  [7-28] 19 (05/07 0726) BP: (86-105)/(52-75) 95/54 (05/07 0700) SpO2:  [91 %-100 %] 95 % (05/07 0726) Arterial Line BP: (95-134)/(41-57) 101/51 (05/06 1900) Weight:  [98.9 kg] 98.9 kg (05/07 0500)  Hemodynamic parameters for last 24 hours: PAP: (35-72)/(13-32) 72/27 CVP:  [8 mmHg-25 mmHg] 25 mmHg CO:  [3.6 L/min-7.4 L/min] 7.4 L/min CI:  [1.8 L/min/m2-3.75 L/min/m2] 3.75 L/min/m2  Intake/Output from previous day: 05/06 0701 - 05/07 0700 In: 1767.1 [P.O.:120; I.V.:932.2; Blood:288; IV Piggyback:426.9] Out: 1320 [Urine:1220; Chest Tube:100] Intake/Output this shift: No intake/output data recorded.  General appearance: alert, cooperative, and drowsy Neurologic: intact Heart: regular rate and rhythm, no murmur Lungs: Slightly diminished breath sounds bibasilar Abdomen: soft, non-tender; bowel sounds normal; no masses,  no organomegaly Extremities: edema trace BLE Wound: Clean and dry dressing in place  Lab Results: Recent Labs    05/24/23 1711 05/25/23 0500  WBC 16.8* 16.0*  HGB 7.7* 7.1*  HCT 23.3* 21.9*  PLT 59* 51*   BMET:  Recent Labs    05/24/23 1711 05/25/23 0500  NA 132* 130*  K 4.2 4.6  CL 101 100  CO2 23 23  GLUCOSE 105* 103*  BUN 22 25*  CREATININE 1.08 1.36*  CALCIUM  8.1*  8.1*    PT/INR:  Recent Labs    05/23/23 1342  LABPROT 20.8*  INR 1.8*   ABG    Component Value Date/Time   PHART 7.375 05/23/2023 1957   HCO3 17.7 (L) 05/23/2023 1957   TCO2 19 (L) 05/23/2023 1957   ACIDBASEDEF 7.0 (H) 05/23/2023 1957   O2SAT 61.7 05/25/2023 0510   CBG (last 3)  Recent Labs    05/25/23 0544 05/25/23 0648 05/25/23 0746  GLUCAP 107* 106* 128*    Assessment/Plan: S/P Procedure(s) (LRB): REPAIR, MITRAL VALVE USING SIMUFORM SEMI-RIGID ANNULOPLASTY RING , CLOSURE OF PATENT FORAMEN OVALE (N/A) ECHOCARDIOGRAM, TRANSESOPHAGEAL, INTRAOPERATIVE (N/A)  Neuro: Pain controlled. Patient is drowsy this AM, may be due to pain meds. Received 3 doses of Oxycodone and 2 doses of Tramadol yesterday and 1 dose of Oxycodone this AM. Monitor and try to use less Oxycodone if pain is controlled.   CV: CI 2.54, CVP 15, Coox 61.7 this AM. On Epi , Vaso 0.02 units, Milrinone  0.25mcg. NSR, HR 80s. BP 105/58 this AM. Wean drips as able per Dr. Luna Salinas. D/C EPW? HFpEF, AHF team following, appreciate their assistance.   Pulm: Saturating well on 2L  this AM. CT removed yesterday. CXR with bibasilar atelectasis. Encourage IS and ambulation.   GI: Passing flatus, tolerating a diet.   Endo: Preop A1C 5.9. On insulin  drip CBGs controlled, will transition to SSI.   Renal: AKI, Cr 1.36 from 1.08 yesterday after 2 doses of IV Lasix  40mg . Up  4 lbs from yesterday? UO only 1220cc/24hrs. Will discuss diuresis with Dr. Luna Salinas.   ID: Likely reactive leukocytosis trending down. Tmax 99. Clinically monitor.   Expected postop ABLA: H/H 7.1/21.9 trending down. Received 1U PRBCs yesterday, may need a transfusion today as well. Reactive thrombocytopenia, plt 51,000. D/C ASA? Hold lovenox.   DVT Prophylaxis: Holding lovenox due to thrombocytopenia  Dispo: Continue ICU care   LOS: 9 days    Randa Burton, PA-C 05/25/2023

## 2023-05-25 NOTE — Progress Notes (Addendum)
 Advanced Heart Failure Rounding Note  Cardiologist: Cody Das, MD  Chief Complaint: Heart Failure  Subjective:   5/5: S/P MVR . Extubated.  5/6: Hgb 7.6. Given 1UPRBC  Hgb post transfusion 7.9. MT removed.    Remains on Vaso 0.02 units,Milrinone  0.25 mcg, and Epi 1 mcg.  CO-OX 62%  Swan  CVP 14 PA 60/21 (31) CI 4.9 CI 2.5   Denies SOB.  Objective:   Weight Range: 98.9 kg Body mass index is 34.15 kg/m.   Vital Signs:   Temp:  [97.5 F (36.4 C)-98.8 F (37.1 C)] 97.9 F (36.6 C) (05/07 0800) Pulse Rate:  [72-99] 87 (05/07 0800) Resp:  [7-28] 12 (05/07 0800) BP: (86-105)/(53-75) 105/58 (05/07 0800) SpO2:  [91 %-100 %] 96 % (05/07 0800) Arterial Line BP: (95-134)/(41-57) 101/51 (05/06 1900) Weight:  [98.9 kg] 98.9 kg (05/07 0500) Last BM Date : 05/19/23  Weight change: Filed Weights   05/23/23 0632 05/24/23 0500 05/25/23 0500  Weight: 84.1 kg 97.2 kg 98.9 kg    Intake/Output:   Intake/Output Summary (Last 24 hours) at 05/25/2023 0844 Last data filed at 05/25/2023 0800 Gross per 24 hour  Intake 1558.16 ml  Output 1150 ml  Net 408.16 ml     CVP 13-14 Physical Exam   General:  In the chair.  No resp difficulty Neck: supple. JVP to jaw . RIJ  Cor: PMI nondisplaced. Regular rate & rhythm. No rubs, gallops or murmurs. Lungs: clear Abdomen: soft, nontender, nondistended.  Extremities: no cyanosis, clubbing, rash, edema Neuro: alert & oriented x3   Telemetry   SR 90s   EKG    N/A   Labs    CBC Recent Labs    05/24/23 1711 05/25/23 0500  WBC 16.8* 16.0*  HGB 7.7* 7.1*  HCT 23.3* 21.9*  MCV 87.3 88.7  PLT 59* 51*   Basic Metabolic Panel Recent Labs    40/98/11 0500 05/24/23 1711 05/25/23 0500  NA 130* 132* 130*  K 3.8 4.2 4.6  CL 102 101 100  CO2 19* 23 23  GLUCOSE 129* 105* 103*  BUN 21 22 25*  CREATININE 1.00 1.08 1.36*  CALCIUM  7.8* 8.1* 8.1*  MG 2.4 2.3  --    Liver Function Tests Recent Labs    05/23/23 0014   AST 48*  ALT 42  ALKPHOS 138*  BILITOT 1.2  PROT 8.3*  ALBUMIN 3.4*   No results for input(s): "LIPASE", "AMYLASE" in the last 72 hours. Cardiac Enzymes No results for input(s): "CKTOTAL", "CKMB", "CKMBINDEX", "TROPONINI" in the last 72 hours.  BNP: BNP (last 3 results) Recent Labs    04/18/23 1618  BNP 566.1*    ProBNP (last 3 results) No results for input(s): "PROBNP" in the last 8760 hours.   D-Dimer No results for input(s): "DDIMER" in the last 72 hours. Hemoglobin A1C Recent Labs    05/22/23 2249  HGBA1C 5.9*   Fasting Lipid Panel No results for input(s): "CHOL", "HDL", "LDLCALC", "TRIG", "CHOLHDL", "LDLDIRECT" in the last 72 hours. Thyroid Function Tests No results for input(s): "TSH", "T4TOTAL", "T3FREE", "THYROIDAB" in the last 72 hours.  Invalid input(s): "FREET3"  Other results:   Imaging    No results found.    Medications:     Scheduled Medications:  acetaminophen   1,000 mg Oral Q6H   Or   acetaminophen  (TYLENOL ) oral liquid 160 mg/5 mL  1,000 mg Per Tube Q6H   aspirin  EC  325 mg Oral Daily   Or  aspirin   324 mg Per Tube Daily   bisacodyl  10 mg Oral Daily   Or   bisacodyl  10 mg Rectal Daily   Chlorhexidine Gluconate Cloth  6 each Topical Daily   cyclobenzaprine   5 mg Oral QHS   docusate sodium  200 mg Oral Daily   insulin  aspart  0-24 Units Subcutaneous Q4H   melatonin  3 mg Oral QHS   pantoprazole  40 mg Oral Daily   rosuvastatin   40 mg Oral Daily   sodium chloride  flush  3 mL Intravenous Q12H    Infusions:  dexmedetomidine  (PRECEDEX ) IV infusion Stopped (05/23/23 1906)   epinephrine  1 mcg/min (05/25/23 0800)   insulin  3 Units/hr (05/25/23 0800)   milrinone  0.25 mcg/kg/min (05/25/23 0800)   norepinephrine  (LEVOPHED ) Adult infusion Stopped (05/24/23 1543)   vasopressin 0.02 Units/min (05/25/23 0800)    PRN Medications: dextrose , metoprolol tartrate, midazolam , morphine injection, ondansetron  (ZOFRAN ) IV, oxyCODONE,  sodium chloride  flush, traMADol    Patient Profile  63 y.o. male with history of severe MR. Admitted for workup/management after TEE and R/LHC. 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 Intra Op Echo EF 30-35%. Plan to repeated Echo after pressors off.  Currently on Vaso 0.03  units, Milrinone  0.25 mcg, and Epi 1 mcg. Continue wean vaso/epi. CO 4.9 CI 2.5 CO-OX 62%.  Diurese today. Give 80 mg IV lasix  now then start lasix  drip 10 mg per hour.    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  Echo 04/22/23: EF 60-65%, grade II DD, septum flattened in systole and diastole c/w RV pressure and volume overload, RVSP 55 mmHg, severe BAE, severe MR Echo  -TEE 05/16/23: EF 50-55%. RV severely reduced. 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  -Repeat RHC 05/20/23- low filling pressures; RA 1, PCWP 14-16. Cardiac index remains reduced by TD despite addition of milrinone  0.25mcg/kg/min - See above. Plan to wean pressors in step wise manner.  - CO-OX stable 62%. Repeat at noon.  - Give 80 mg IV lasix . Start lasix  drip 10 mg per hour. Weight up >20 pounds.  -Renal function stable.    3. Anemia, Expected Blood Loss Hgb down 7.1. Discussed with Dr Luna Salinas. Give another unit of blood.  Check CBC after transfusion.   4. CAD Cath with nonobstructive CAD.  Continue statin.    Place PICC. Remove swan once PICC placed.  Length of Stay: 9  Amy Clegg, NP  05/25/2023, 8:44 AM CRITICAL CARE Performed by: Nieves Bars   Total critical care time: 11 minutes  Critical care time was exclusive of separately billable procedures and treating other patients.  Critical care was necessary to treat or prevent imminent or life-threatening deterioration.  Critical care was time spent personally by me on the following activities: development of treatment plan with patient and/or  surrogate as well as nursing, discussions with consultants, evaluation of patient's response to treatment, examination of patient, obtaining history from patient or surrogate, ordering and performing treatments and interventions, ordering and review of laboratory studies, ordering and review of radiographic studies, pulse oximetry and re-evaluation of patient's condition.  Advanced Heart Failure Team Pager [phone] (M-F; 7a - 5p)  Please contact [facility] for night-coverage after hours (5p -7a ) and weekends on [email or web address]  Patient seen with NP, I formulated the plan and agree with the above note .  CVP 18  this morning, co-ox 62% with CI 2.5 by thermo.  Now on milrinone  0.25, vasopressin 0.03, and epinephrine  1.   No dyspnea at rest.   General: NAD Neck:JVP 16 cm, no thyromegaly or thyroid nodule.  Lungs: Decreased at bases.  CV: Nondisplaced PMI.  Heart regular S1/S2, no S3/S4, no murmur.  1+ ankle edema.  Abdomen: Soft, nontender, no hepatosplenomegaly, no distention.  Skin: Intact without lesions or rashes.  Neurologic: Alert and oriented x 3.  Psych: Normal affect. Extremities: No clubbing or cyanosis.  HEENT: Normal.   Now s/p MV repair with annuloplasty ring and PFO closure. EF looks lower now that MR corrected and LV afterload is higher (EF 30% range). CVP 18 today, CI 2.5 by thermodilution with co-ox 62%.  Needs diuresis.  - Gradually weaning pressors, down to epinephrine  1 and vasopressin 0.03.    - Continue milrinone  0.25.  - Lasix  80 mg IV x 1 then 10 mg/hr.   I will give 1 unit PRBCs today for hgb 7.1.   CRITICAL CARE Performed by: Peder Bourdon  Total critical care time: 40 minutes  Critical care time was exclusive of separately billable procedures and treating other patients.  Critical care was necessary to treat or prevent imminent or life-threatening deterioration.  Critical care was time spent personally by me on the following activities: development of  treatment plan with patient and/or surrogate as well as nursing, discussions with consultants, evaluation of patient's response to treatment, examination of patient, obtaining history from patient or surrogate, ordering and performing treatments and interventions, ordering and review of laboratory studies, ordering and review of radiographic studies, pulse oximetry and re-evaluation of patient's condition.  Peder Bourdon 05/25/2023 12:25 PM

## 2023-05-25 NOTE — Op Note (Signed)
 NAME: Calvin Drake MEDICAL RECORD NO: [id number] ACCOUNT NO: [id number] DATE OF BIRTH: [birth year] FACILITY: [facility] LOCATION: [facility] PHYSICIAN: Milon Aloe. Luna Salinas, MD  Operative Report   DATE OF PROCEDURE: 05/23/2023  PREOPERATIVE DIAGNOSIS: Severe mitral regurgitation.  POSTOPERATIVE DIAGNOSES: Severe mitral regurgitation and small patent foramen ovale.  PROCEDURE:  Median sternotomy, extracorporeal circulation,  Mitral valve repair using 16-mm Gore-Tex Neochords to P2 and P3 leaflets, closure of cleft between P2 and P3 and 32-mm Medtronic SimuForm annuloplasty ring (model number 7800 RR, serial number [id number]).  Closure of patent foramen ovale.  CLINICAL NOTE: Calvin Drake is a 63 year old man who was in his usual state of health until 01/2023. He had a 3-week illness, which he initially thought was respiratory, but once he was evaluated, it was discovered that he was in congestive heart failure.  Workup revealed severe mitral regurgitation. He was referred to cardiology. He underwent cardiac catheterization which showed moderate hemodynamically significant disease in the mid LAD, but otherwise coronaries were relatively free of disease. He was  referred for mitral valve repair. The indications, risks, benefits, and alternatives were discussed in detail with the patient. Initially, he had significant right heart failure. He was treated with diuretics and had improvement of his right heart function and is now felt ready to proceed with mitral valve repair or possible replacement. The patient understood the indications, risks, benefits, and alternatives and agreed to proceed.  OPERATIVE NOTE: Calvin Drake was brought to the preoperative holding area on 05/23/2023. Anesthesia placed a Swan-Ganz catheter and arterial blood pressure monitoring line. He was taken to the operating room, anesthetized and intubated. A Foley catheter  was placed. Intravenous antibiotics were administered.  Transesophageal echocardiography was performed by Dr. Elby Green of anesthesia. Please see his separately dictated note for full details. Initially, it showed preserved left ventricular function with severe mitral regurgitation and a small patent foramen ovale. There was no significant tricuspid regurgitation. The chest, abdomen, and legs were prepped and draped in the usual sterile fashion.  A time-out was performed. A median sternotomy was performed. Initial hemostasis was obtained. The patient then was fully heparinized. The sternal retractor was placed and was gradually opened over time. The pericardium was opened. The ascending aorta was inspected. It was of normal caliber with no evidence of atherosclerotic disease. After confirming adequate anticoagulation with ACT measurement, the aorta was cannulated via concentric 2-0 Ethibond pledgeted pursestring sutures. A 31-French right-angle metal tip venous cannula was placed via a pursestring suture into the superior vena cava. Cardiopulmonary bypass was initiated. A 36-French malleable venous cannula was placed via a pursestring suture in the right atrium inferiorly and directed into the inferior vena cava.  The inferior vena cava cannula was connected to the bypass circuit and flows were maintained per protocol. The patient was cooled to 32 degrees Celsius. Caval tapes were placed, but were only tightened during the open portion of the procedure. A retrograde cardioplegia cannula was placed via pursestring suture into the right atrium.A foam pad was placed in the pericardium to insulate the heart. A temperature probe was placed in the myocardial septum. A cardioplegia cannula was placed via a pursestring suture in the right atrium and directed into the coronary sinus and an antegrade cardioplegic cannula was placed in the ascending aorta.  The aorta was cross-clamped. The left ventricle was emptied via the aortic root vent. Cardiac arrest then was achieved with a  combination of cold antegrade and retrograde KBC blood cardioplegia. An initial 500 mL was given antegrade and  there was a rapid diastolic arrest. The remainder was given via the retrograde cannula. Additional doses were given at 60 and then 30-minute intervals throughout the cross-clamp portion of the procedure.  Carbon dioxide was insufflated into the operative field. A left atriotomy was performed. The valve was inspected. There were ruptured chordae. The majority of the P3 leaflet was flail as well as the adjacent portion of the P2 leaflet. There also was a  cleft between the two leaflets. The cords to P1 measured at 16 mm. 16-mm Gore-Tex Neochords were prepared. The suture was placed through the papillary muscle. A second pledget was placed and the suture was tied. Two of the sutures were placed through  the edge of the P3 portion of the posterior leaflet and the other was placed through the posteromedial portion of the P2 leaflet. These sutures were tied. The anterior leaflet was normal in appearance. 2-0 Ethibond horizontal mattress sutures then were placed through the annulus circumferentially.  The annulus sized initially for a 34-mm annuloplasty ring. The sutures were placed through the SimuForm ring, which was lowered into place. However, with pressure testing by instilling ice saline into the left ventricle, there was still significant mitral regurgitation. It should be noted that the cleft between P2 and P3 was closed with 4-0 Ethibond sutures. The decision was made to remove the ring and place a smaller ring. The ring was removed. Annular sutures were then again placed circumferentially passed through the sewing ring of a 32-mm SimuForm semi-rigid annuloplasty ring, which was lowered into place. The sutures were secured with a Cor-Knot device. At this time with testing, the valve was competent. Rewarming was begun. The left atriotomy was closed in two layers. The first layer was a running 4-0  Prolene horizontal mattress suture followed by a running 4-0 Prolene simple suture. Deairing maneuvers was performed prior to tying the first layer. At the completion of the closure of the left atriotomy, the patient was placed in Trendelenburg position. A reanimation dose of KBC cardioplegia was administered via the retrograde cannula. Additional deairing was performed and the aortic cross-clamp was removed. The total cross-clamp time was 162 minutes. The patient spontaneously resumed initially a tachycardic rhythm, but that broke and converted to sinus rhythm. He did not require defibrillation. While rewarming was completed, a right atriotomy was performed and the patent foramen ovale was closed with a 4-0 Prolene figure-of-eight suture. The right atriotomy was closed with a 4-0 Prolene suture. Epicardial pacing wires were placed on the right ventricle and right atrium. When the patient had rewarmed to a core temperature of 37 degrees Celsius, he remained in Trendelenburg position. Additional deairing maneuvers were performed. The patient then was weaned from cardiopulmonary bypass on the first attempt. He was on Milrinone  0.25 mcg per kilogram per minute on arrival to the OR and remained on that post bypass. He also was started on epinephrine  and norepinephrine  infusions prior to separation from bypass. Total bypass time was 204 minutes. The initial cardiac index was greater than 2 liters per minute per meter squared. He remained hemodynamically stable throughout the post bypass period.  The inferior vena caval cannula had been removed prior to weaning from bypass and the superior vena caval cannula had been repositioned into the right atrium. After a test dose of protamine  was tolerated, the superior vena caval cannula and the aortic  cannula were removed. The remainder of the protamine  was administered without incident. The chest was irrigated with warm saline. Hemostasis was achieved. The  patient was noted  to be thrombocytopenic with a platelet count of 95. Platelets were ordered,  but were not administered until arrival to the ICU. A 24-French Blake drain was placed in the pericardium along its diaphragmatic surface and a 36-French chest tube was placed in the anterior mediastinum. The sternum was closed with a combination of single and double heavy-gauge stainless steel wires. The pectoralis fascia, subcutaneous tissue, and skin were closed in a standard fashion. All sponge, needle, and instrument counts were correct at the end of the procedure. The patient was transported from the operating room to the surgical intensive care unit, intubated and in fair condition.    SUJ D: 05/24/2023 12:25:32 pm T: 05/25/2023 3:05:00 am  JOB: [id number]/ [id number]

## 2023-05-26 ENCOUNTER — Inpatient Hospital Stay (HOSPITAL_COMMUNITY): Payer: Self-pay

## 2023-05-26 ENCOUNTER — Encounter (HOSPITAL_COMMUNITY): Payer: Self-pay | Admitting: Thoracic Surgery (Cardiothoracic Vascular Surgery)

## 2023-05-26 ENCOUNTER — Encounter: Payer: Self-pay | Admitting: Surgery

## 2023-05-26 DIAGNOSIS — I5030 Unspecified diastolic (congestive) heart failure: Secondary | ICD-10-CM

## 2023-05-26 LAB — BPAM RBC
Blood Product Expiration Date: 202505302359
Blood Product Expiration Date: 202506042359
ISSUE DATE / TIME: 202505060819
ISSUE DATE / TIME: 202505070923
Unit Type and Rh: 7300
Unit Type and Rh: 7300

## 2023-05-26 LAB — TYPE AND SCREEN
ABO/RH(D): B POS
Antibody Screen: NEGATIVE
Unit division: 0
Unit division: 0

## 2023-05-26 LAB — ECHOCARDIOGRAM COMPLETE
Area-P 1/2: 2.95 cm2
Height: 67 in
MV VTI: 1.56 cm2
S' Lateral: 3.6 cm
Single Plane A4C EF: 41.9 %
Weight: 3460.34 [oz_av]

## 2023-05-26 LAB — CBC
HCT: 23.5 % — ABNORMAL LOW (ref 39.0–52.0)
Hemoglobin: 7.9 g/dL — ABNORMAL LOW (ref 13.0–17.0)
MCH: 29.4 pg (ref 26.0–34.0)
MCHC: 33.6 g/dL (ref 30.0–36.0)
MCV: 87.4 fL (ref 80.0–100.0)
Platelets: 65 10*3/uL — ABNORMAL LOW (ref 150–400)
RBC: 2.69 MIL/uL — ABNORMAL LOW (ref 4.22–5.81)
RDW: 14 % (ref 11.5–15.5)
WBC: 14.2 10*3/uL — ABNORMAL HIGH (ref 4.0–10.5)
nRBC: 0 % (ref 0.0–0.2)

## 2023-05-26 LAB — COOXEMETRY PANEL
Carboxyhemoglobin: 2 % — ABNORMAL HIGH (ref 0.5–1.5)
Methemoglobin: <0.7 % (ref 0.0–1.5)
O2 Saturation: 67.1 %
Total hemoglobin: 8.5 g/dL — ABNORMAL LOW (ref 12.0–16.0)

## 2023-05-26 LAB — BASIC METABOLIC PANEL WITH GFR
Anion gap: 9 (ref 5–15)
Anion gap: 8 (ref 5–15)
BUN: 29 mg/dL — ABNORMAL HIGH (ref 8–23)
BUN: 34 mg/dL — ABNORMAL HIGH (ref 8–23)
CO2: 25 mmol/L (ref 22–32)
CO2: 25 mmol/L (ref 22–32)
Calcium: 8 mg/dL — ABNORMAL LOW (ref 8.9–10.3)
Calcium: 7.7 mg/dL — ABNORMAL LOW (ref 8.9–10.3)
Chloride: 96 mmol/L — ABNORMAL LOW (ref 98–111)
Chloride: 97 mmol/L — ABNORMAL LOW (ref 98–111)
Creatinine, Ser: 1.72 mg/dL — ABNORMAL HIGH (ref 0.61–1.24)
Creatinine, Ser: 2.18 mg/dL — ABNORMAL HIGH (ref 0.61–1.24)
GFR, Estimated: 45 mL/min — ABNORMAL LOW (ref >60)
GFR, Estimated: 34 mL/min — ABNORMAL LOW (ref >60)
Glucose, Bld: 121 mg/dL — ABNORMAL HIGH (ref 70–99)
Glucose, Bld: 136 mg/dL — ABNORMAL HIGH (ref 70–99)
Potassium: 3.2 mmol/L — ABNORMAL LOW (ref 3.5–5.1)
Potassium: 3.4 mmol/L — ABNORMAL LOW (ref 3.5–5.1)
Sodium: 130 mmol/L — ABNORMAL LOW (ref 135–145)
Sodium: 130 mmol/L — ABNORMAL LOW (ref 135–145)

## 2023-05-26 LAB — GLUCOSE, CAPILLARY
Glucose-Capillary: 133 mg/dL — ABNORMAL HIGH (ref 70–99)
Glucose-Capillary: 152 mg/dL — ABNORMAL HIGH (ref 70–99)
Glucose-Capillary: 98 mg/dL (ref 70–99)
Glucose-Capillary: 143 mg/dL — ABNORMAL HIGH (ref 70–99)
Glucose-Capillary: 86 mg/dL (ref 70–99)

## 2023-05-26 MED ORDER — INSULIN ASPART 100 UNIT/ML IJ SOLN
5.0000 [IU] | Freq: Three times a day (TID) | INTRAMUSCULAR | Status: DC
  Administered 2023-05-26 – 2023-05-27 (×2): 5 [IU] via SUBCUTANEOUS

## 2023-05-26 MED ORDER — POTASSIUM CHLORIDE CRYS ER 20 MEQ PO TBCR
40.0000 meq | EXTENDED_RELEASE_TABLET | ORAL | Status: AC
Start: 2023-05-26 — End: 2023-05-26
  Administered 2023-05-26 (×2): 40 meq via ORAL
  Filled 2023-05-26 (×2): qty 2

## 2023-05-26 MED ORDER — POTASSIUM CHLORIDE 20 MEQ PO PACK
40.0000 meq | PACK | ORAL | Status: DC
  Filled 2023-05-26: qty 2

## 2023-05-26 MED ORDER — POTASSIUM CHLORIDE 10 MEQ/100ML IV SOLN
10.0000 meq | INTRAVENOUS | Status: AC
  Administered 2023-05-26 – 2023-05-27 (×6): 10 meq via INTRAVENOUS
  Filled 2023-05-26 (×3): qty 100

## 2023-05-26 MED ORDER — INSULIN ASPART 100 UNIT/ML IJ SOLN: 0.0000 [IU] | Freq: Every day | INTRAMUSCULAR | Status: DC

## 2023-05-26 MED ORDER — INSULIN ASPART 100 UNIT/ML IJ SOLN
0.0000 [IU] | Freq: Three times a day (TID) | INTRAMUSCULAR | Status: DC
  Administered 2023-05-26: 2 [IU] via SUBCUTANEOUS
  Administered 2023-05-27: 3 [IU] via SUBCUTANEOUS

## 2023-05-26 MED ORDER — SORBITOL 70 % SOLN
30.0000 mL | Freq: Once | Status: AC
  Administered 2023-05-26: 30 mL via ORAL
  Filled 2023-05-26: qty 30

## 2023-05-26 MED ORDER — POTASSIUM CHLORIDE CRYS ER 20 MEQ PO TBCR
20.0000 meq | EXTENDED_RELEASE_TABLET | ORAL | Status: DC
  Administered 2023-05-26 (×2): 20 meq via ORAL
  Filled 2023-05-26 (×2): qty 1

## 2023-05-26 NOTE — Progress Notes (Addendum)
 Advanced Heart Failure Rounding Note  Cardiologist: Cody Das, MD  Chief Complaint: Heart Failure  Subjective:   5/5: S/P MVR . Extubated.  5/6: Hgb 7.6. Given 1UPRBC  Hgb post transfusion 7.9. MT removed.   5/7: Diuresed with IV lasix  and switched to lasix  drip. Given 1UPRBCs. Weaned off Vaso & Epi.   Brisk diuresis. Negative 2.2 liters.   Milrinone  0.25 mcg. CO-OX 67%.   Creatinine 1.4>1.7   CO-OX 67%  Denies SOB.   Objective:   Weight Range: 98.1 kg Body mass index is 33.87 kg/m.   Vital Signs:   Temp:  [97.6 F (36.4 C)-99 F (37.2 C)] 97.9 F (36.6 C) (05/08 0839) Pulse Rate:  [84-110] 103 (05/08 0800) Resp:  [12-38] 16 (05/08 0800) BP: (78-124)/(47-94) 102/59 (05/08 0800) SpO2:  [88 %-100 %] 96 % (05/08 0800) Weight:  [98.1 kg] 98.1 kg (05/08 0500) Last BM Date : 05/19/23  Weight change: Filed Weights   05/24/23 0500 05/25/23 0500 05/26/23 0500  Weight: 97.2 kg 98.9 kg 98.1 kg    Intake/Output:   Intake/Output Summary (Last 24 hours) at 05/26/2023 0928 Last data filed at 05/26/2023 0800 Gross per 24 hour  Intake 727.32 ml  Output 3025 ml  Net -2297.68 ml     CVP 11 Physical Exam   General: In the chair.   No resp difficulty Neck: supple. JVP 11-12.  Cor: PMI nondisplaced. Regular rate & rhythm. No rubs, gallops or murmurs. Lungs: clear Abdomen: soft, nontender, nondistended.  Extremities: no cyanosis, clubbing, rash, R and LLE 1-2+ edema Neuro: alert & oriented x3   Telemetry   SR 90s   EKG    N/A   Labs    CBC Recent Labs    05/25/23 1533 05/26/23 0400  WBC 14.4* 14.2*  HGB 8.1* 7.9*  HCT 24.8* 23.5*  MCV 88.6 87.4  PLT 59* 65*   Basic Metabolic Panel Recent Labs    16/10/96 0500 05/24/23 1711 05/25/23 0500 05/25/23 1533 05/26/23 0400  NA 130* 132*   < > 129* 130*  K 3.8 4.2   < > 3.8 3.2*  CL 102 101   < > 95* 96*  CO2 19* 23   < > 22 25  GLUCOSE 129* 105*   < > 189* 121*  BUN 21 22   < > 28* 29*   CREATININE 1.00 1.08   < > 1.43* 1.72*  CALCIUM  7.8* 8.1*   < > 8.0* 8.0*  MG 2.4 2.3  --   --   --    < > = values in this interval not displayed.   Liver Function Tests No results for input(s): "AST", "ALT", "ALKPHOS", "BILITOT", "PROT", "ALBUMIN" in the last 72 hours.  No results for input(s): "LIPASE", "AMYLASE" in the last 72 hours. Cardiac Enzymes No results for input(s): "CKTOTAL", "CKMB", "CKMBINDEX", "TROPONINI" in the last 72 hours.  BNP: BNP (last 3 results) Recent Labs    04/18/23 1618  BNP 566.1*    ProBNP (last 3 results) No results for input(s): "PROBNP" in the last 8760 hours.   D-Dimer No results for input(s): "DDIMER" in the last 72 hours. Hemoglobin A1C No results for input(s): "HGBA1C" in the last 72 hours.  Fasting Lipid Panel No results for input(s): "CHOL", "HDL", "LDLCALC", "TRIG", "CHOLHDL", "LDLDIRECT" in the last 72 hours. Thyroid Function Tests No results for input(s): "TSH", "T4TOTAL", "T3FREE", "THYROIDAB" in the last 72 hours.  Invalid input(s): "FREET3"  Other results:   Imaging  No results found.    Medications:     Scheduled Medications:  sodium chloride    Intravenous Once   acetaminophen   1,000 mg Oral Q6H   Or   acetaminophen  (TYLENOL ) oral liquid 160 mg/5 mL  1,000 mg Per Tube Q6H   aspirin  EC  325 mg Oral Daily   Or   aspirin   324 mg Per Tube Daily   bisacodyl  10 mg Oral Daily   Or   bisacodyl  10 mg Rectal Daily   Chlorhexidine Gluconate Cloth  6 each Topical Daily   cyclobenzaprine   5 mg Oral QHS   docusate sodium  200 mg Oral Daily   insulin  aspart  0-15 Units Subcutaneous TID WC   insulin  aspart  0-5 Units Subcutaneous QHS   insulin  aspart  5 Units Subcutaneous TID WC   insulin  glargine-yfgn  25 Units Subcutaneous BID   melatonin  3 mg Oral QHS   pantoprazole  40 mg Oral Daily   potassium chloride   20 mEq Oral Q4H   rosuvastatin   40 mg Oral Daily   sodium chloride  flush  10-40 mL Intracatheter Q12H    sodium chloride  flush  3 mL Intravenous Q12H    Infusions:  epinephrine  1 mcg/min (05/26/23 0800)   furosemide  (LASIX ) 200 mg in dextrose  5 % 100 mL (2 mg/mL) infusion 10 mg/hr (05/26/23 0800)   milrinone  0.25 mcg/kg/min (05/26/23 0800)   norepinephrine  (LEVOPHED ) Adult infusion Stopped (05/24/23 1543)   vasopressin Stopped (05/26/23 0212)    PRN Medications: dextrose , metoprolol tartrate, midazolam , morphine injection, ondansetron  (ZOFRAN ) IV, oxyCODONE, sodium chloride  flush, sodium chloride  flush, traMADol    Patient Profile  63 y.o. male with history of severe MR. Admitted for workup/management after TEE and R/LHC. 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 Intra Op Echo EF 30-35%.  Off pressors. Remains on milrinone  0.25 mcg + Epi 1 mcg. Stop Epi today.   CO-OX stable 67%.   Continue to mobilze. Continue to diurese.  -Repeat ECHO    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  Echo 04/22/23: EF 60-65%, grade II DD, septum flattened in systole and diastole c/w RV pressure and volume overload, RVSP 55 mmHg, severe BAE, severe MR Echo  -TEE 05/16/23: EF 50-55%. RV severely reduced. 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  -Repeat RHC 05/20/23- low filling pressures; RA 1, PCWP 14-16. Cardiac index remains reduced by TD despite addition of milrinone  0.25mcg/kg/min - Remains on milrinone  0.25 mcg. CO-OX 67%.  CVP 11. Continue lasix  drip 10 mg per hour.  Watch renal function.  Repeat ECHO  - Add Ted Hose  3. Anemia, Expected Blood Loss Got 1UPRBC 5/6 and 5/7.  Hgb 7.9   4. CAD Cath with nonobstructive CAD.  Continue statin.       Length of Stay: 10  Calvin Bars, NP  05/26/2023, 9:28 AM CRITICAL CARE Performed by: Calvin Drake   Total critical care time: 11 minutes  Critical care time was exclusive of separately billable procedures  and treating other patients.  Critical care was necessary to treat or prevent imminent or life-threatening deterioration.  Critical care was time spent personally by me on the following activities: development of treatment plan with patient and/or surrogate as well as nursing, discussions with consultants, evaluation of patient's response to treatment, examination of patient, obtaining history from patient or surrogate, ordering  and performing treatments and interventions, ordering and review of laboratory studies, ordering and review of radiographic studies, pulse oximetry and re-evaluation of patient's condition.  Advanced Heart Failure Team Pager [phone] (M-F; 7a - 5p)  Please contact [facility] for night-coverage after hours (5p -7a ) and weekends on [email or web address]  Patient seen with NP, I formulated the plan and agree with the above note.   Epinephrine  and vasopressin now off, he remains on milrinone  0.25 with co-ox 67% today.  Good diuresis, I/Os neg negative 2277 and weight down 2 lbs but creatinine up some to 1.72.  CVP 15 on my read.   Hgb 7.9, had 1 unit PRBCs yesterday.   General: NAD Neck: JVP 14 cm, no thyromegaly or thyroid nodule.  Lungs: Clear to auscultation bilaterally with normal respiratory effort. CV: Nondisplaced PMI.  Heart regular S1/S2, no S3/S4, no murmur.  1+ edema 1/2 to knees.   Abdomen: Soft, nontender, no hepatosplenomegaly, no distention.  Skin: Intact without lesions or rashes.  Neurologic: Alert and oriented x 3.  Psych: Normal affect. Extremities: No clubbing or cyanosis.  HEENT: Normal.   Now s/p MV repair with annuloplasty ring and PFO closure. EF looked lower now on TEE since MR corrected and LV afterload is higher (EF 30% range). CVP 15 today, Co-ox 67%.  Creatinine trending up at 1.72, need to watch closely.  - Now off pressors and just on milrinone  0.25.  Continue milrinone  while diuresing.   - Continue Lasix  gtt 10 mg/hr.   - I will get formal  echo to reassess LV systolic function.    Hgb 7.9, transfuse < 7.5.   CRITICAL CARE Performed by: Calvin Drake  Total critical care time: 40 minutes  Critical care time was exclusive of separately billable procedures and treating other patients.  Critical care was necessary to treat or prevent imminent or life-threatening deterioration.  Critical care was time spent personally by me on the following activities: development of treatment plan with patient and/or surrogate as well as nursing, discussions with consultants, evaluation of patient's response to treatment, examination of patient, obtaining history from patient or surrogate, ordering and performing treatments and interventions, ordering and review of laboratory studies, ordering and review of radiographic studies, pulse oximetry and re-evaluation of patient's condition.  Calvin Drake 05/26/2023 10:42 AM

## 2023-05-26 NOTE — Evaluation (Signed)
 Physical Therapy Evaluation Patient Details Name: Calvin Drake MRN: [id number] DOB: 1961/01/07 Today's Date: 05/26/2023  History of Present Illness  63 y.o. male [facility]16[facility]23 with exertional SOB and fatigue. Pt with severe MR and moderate CAD. 4/28 R/L heart cath and angiography. 5/2 R heart cath. 5/5 MVR and closure of patent foramen ovale. 5/6 given 1UPRBC. PMHx: CHF, CAD   Clinical Impression  Pt in hallway with RN upon arrival and agreeable to PT eval. PTA, pt was independent for mobility with no AD. In today's session, pt required CGA for sit<>stand transfer and supervision to ambulate 49ft with Calvin Drake walker. Pt had increased difficulty with bed mobility due to pain in sternum requiring ModA. Pt will have 24/7 physical assist upon return home. Anticipating pt will progress well with continued acute rehab with no anticipated post-acute PT needs. Recommending continuation with cardiac rehab. Pt currently with functional limitations due to the deficits listed below (see PT Problem List). Pt would benefit from acute skilled PT to address functional impairments. Acute PT to follow.         If plan is discharge home, recommend the following: A little help with walking and/or transfers;A little help with bathing/dressing/bathroom;Assistance with cooking/housework;Assist for transportation;Help with stairs or ramp for entrance   Can travel by private vehicle    Yes    Equipment Recommendations Rolling walker (2 wheels)  Recommendations for Other Services  OT consult    Functional Status Assessment Patient has had a recent decline in their functional status and demonstrates the ability to make significant improvements in function in a reasonable and predictable amount of time.     Precautions / Restrictions Precautions Precautions: Fall;Sternal Precaution Booklet Issued: Yes (comment) Recall of Precautions/Restrictions: Intact Restrictions Other Position/Activity Restrictions:  sternal      Mobility  Bed Mobility Overal bed mobility: Needs Assistance Bed Mobility: Sit to Sidelying, Rolling Rolling: Contact guard assist    Sit to sidelying: Mod assist General bed mobility comments: ModA to bring LE's onto EOB    Transfers Overall transfer level: Needs assistance Equipment used: Rolling walker (2 wheels) Transfers: Sit to/from Stand Sit to Stand: Contact guard assist    General transfer comment: cues for safety    Ambulation/Gait Ambulation/Gait assistance: Supervision Gait Distance (Feet): 400 Feet Assistive device: Calvin Drake Gait Pattern/deviations: Step-through pattern, Decreased stride length Gait velocity: decr     General Gait Details: slow and steady gait     Balance Overall balance assessment: Needs assistance Sitting-balance support: No upper extremity supported, Feet supported Sitting balance-Leahy Scale: Fair     Standing balance support: No upper extremity supported Standing balance-Leahy Scale: Fair Standing balance comment: able to stand statically with no AD        Pertinent Vitals/Pain Pain Assessment Pain Assessment: No/denies pain    Home Living Family/patient expects to be discharged to:: Private residence Living Arrangements: Spouse/significant other Available Help at Discharge: Family;Available 24 hours/day Type of Home: House Home Access: Level entry    Home Layout: Two level;Able to live on main level with bedroom/bathroom Home Equipment: Shower seat      Prior Function Prior Level of Function : Independent/Modified Independent;Driving;History of Falls (last six months)      Mobility Comments: Ind with no AD ADLs Comments: Ind     Extremity/Trunk Assessment   Upper Extremity Assessment Upper Extremity Assessment: Defer to OT evaluation    Lower Extremity Assessment Lower Extremity Assessment: Overall WFL for tasks assessed    Cervical / Trunk  Assessment Cervical / Trunk Assessment: Normal   Communication   Communication Communication: No apparent difficulties    Cognition Arousal: Alert Behavior During Therapy: Flat affect   PT - Cognitive impairments: No apparent impairments    Following commands: Intact       Cueing Cueing Techniques: Verbal cues      PT Assessment Patient needs continued PT services  PT Problem List Decreased activity tolerance;Decreased balance;Decreased mobility       PT Treatment Interventions DME instruction;Gait training;Stair training;Functional mobility training;Therapeutic activities;Therapeutic exercise;Balance training;Patient/family education;Neuromuscular re-education    PT Goals (Current goals can be found in the Care Plan section)  Acute Rehab PT Goals Patient Stated Goal: to get better PT Goal Formulation: With patient/family Time For Goal Achievement: 06/09/23 Potential to Achieve Goals: Good    Frequency Min 2X/week        AM-PAC PT "6 Clicks" Mobility  Outcome Measure Help needed turning from your back to your side while in a flat bed without using bedrails?: A Lot Help needed moving from lying on your back to sitting on the side of a flat bed without using bedrails?: A Lot Help needed moving to and from a bed to a chair (including a wheelchair)?: A Little Help needed standing up from a chair using your arms (e.g., wheelchair or bedside chair)?: A Little Help needed to walk in hospital room?: A Little Help needed climbing 3-5 steps with a railing? : A Little 6 Click Score: 16    End of Session Equipment Utilized During Treatment: Oxygen Activity Tolerance: Patient tolerated treatment well Patient left: in bed;with call bell/phone within reach;with family/visitor present;with nursing/sitter in room Nurse Communication: Mobility status PT Visit Diagnosis: Other abnormalities of gait and mobility (R26.89)    Time: 9147-8295 PT Time Calculation (min) (ACUTE ONLY): 17 min   Charges:   PT Evaluation $PT Eval  Low Complexity: 1 Low   PT General Charges $$ ACUTE PT VISIT: 1 Visit        Calvin Drake, PT, DPT Secure Chat Preferred  Rehab Office [phone]   Calvin Drake 05/26/2023, 2:40 PM

## 2023-05-26 NOTE — Progress Notes (Signed)
 3 Days Post-Op Procedure(s) (LRB): REPAIR, MITRAL VALVE USING SIMUFORM SEMI-RIGID ANNULOPLASTY RING , CLOSURE OF PATENT FORAMEN OVALE (N/A) ECHOCARDIOGRAM, TRANSESOPHAGEAL, INTRAOPERATIVE (N/A) Subjective: C/o pain after taking potassium Walked around unit this AM   Objective: Vital signs in last 24 hours: Temp:  [97.6 F (36.4 C)-99 F (37.2 C)] 97.6 F (36.4 C) (05/08 0500) Pulse Rate:  [84-110] 103 (05/08 0800) Cardiac Rhythm: Sinus tachycardia (05/08 0800) Resp:  [12-38] 16 (05/08 0800) BP: (78-124)/(47-94) 102/59 (05/08 0800) SpO2:  [88 %-100 %] 96 % (05/08 0800) Weight:  [98.1 kg] 98.1 kg (05/08 0500)  Hemodynamic parameters for last 24 hours: PAP: (60-87)/(20-35) 68/21 CVP:  [7 mmHg-33 mmHg] 13 mmHg CO:  [5.5 L/min] 5.5 L/min CI:  [2.8 L/min/m2] 2.8 L/min/m2  Intake/Output from previous day: 05/07 0701 - 05/08 0700 In: 748.2 [I.V.:418.2; Blood:330] Out: 3025 [Urine:3025] Intake/Output this shift: Total I/O In: 14.3 [I.V.:14.3] Out: -   General appearance: alert, cooperative, and no distress Neurologic: intact Heart: tachy, regular Lungs: diminished breath sounds bibasilar Abdomen: mildly distended nontender Extremities: 2+ edema  Lab Results: Recent Labs    05/25/23 1533 05/26/23 0400  WBC 14.4* 14.2*  HGB 8.1* 7.9*  HCT 24.8* 23.5*  PLT 59* 65*   BMET:  Recent Labs    05/25/23 1533 05/26/23 0400  NA 129* 130*  K 3.8 3.2*  CL 95* 96*  CO2 22 25  GLUCOSE 189* 121*  BUN 28* 29*  CREATININE 1.43* 1.72*  CALCIUM  8.0* 8.0*    PT/INR:  Recent Labs    05/23/23 1342  LABPROT 20.8*  INR 1.8*   ABG    Component Value Date/Time   PHART 7.375 05/23/2023 1957   HCO3 17.7 (L) 05/23/2023 1957   TCO2 19 (L) 05/23/2023 1957   ACIDBASEDEF 7.0 (H) 05/23/2023 1957   O2SAT 67.1 05/26/2023 0520   CBG (last 3)  Recent Labs    05/25/23 1922 05/25/23 2308 05/26/23 0409  GLUCAP 213* 208* 133*    Assessment/Plan: S/P Procedure(s)  (LRB): REPAIR, MITRAL VALVE USING SIMUFORM SEMI-RIGID ANNULOPLASTY RING , CLOSURE OF PATENT FORAMEN OVALE (N/A) ECHOCARDIOGRAM, TRANSESOPHAGEAL, INTRAOPERATIVE (N/A) POD # 3 NEURO_ intact CV- in sinus tachycardia  Milrinone  0.25, epi 1- co-ox 67  ASA, statin  Start beta blocker when off inotropes  ECG to check QTc RESP- continue IS RENAL- creatinine up slightly to 1.7 c/w AKI  Continue diuresis  Hyponatremia- stable  Hypokalemia- supplement K ENDO-CBG elevated, add meal coverage  Continue SemGlee, change SSI to [facility] and HS GI- tolerating diet Thrombocytopenia- PLT up slightly to 65K Anemia- stable, monitor Continue ambulation  LOS: 10 days    Calvin Drake 05/26/2023

## 2023-05-26 NOTE — Progress Notes (Signed)
  Echocardiogram 2D Echocardiogram has been performed.  Calvin Drake 05/26/2023, 11:57 AM

## 2023-05-26 NOTE — TOC Progression Note (Signed)
 Transition of Care [facility]) - Progression Note    Patient Details  Name: Calvin Drake MRN: [id number] Date of Birth: Jan 27, 1960  Transition of Care [facility]) CM/SW Contact  Ernst Heap Phone Number: [phone] 05/26/2023, 9:54 AM  Clinical Narrative:   Will continue to follow patient to monitor dc needs.    TOC will continue to follow.     Expected Discharge Plan: Home/Self Care Barriers to Discharge: Continued Medical Work up  Expected Discharge Plan and Services   Discharge Planning Services: CM Consult   Living arrangements for the past 2 months: Single Family Home                                       Social Determinants of Health (SDOH) Interventions SDOH Screenings   Food Insecurity: No Food Insecurity (05/17/2023)  Housing: Low Risk  (05/17/2023)  Transportation Needs: No Transportation Needs (05/17/2023)  Utilities: Not At Risk (05/17/2023)  Tobacco Use: Low Risk  (05/23/2023)    Readmission Risk Interventions     No data to display

## 2023-05-26 NOTE — Progress Notes (Signed)
 EVENING ROUNDS NOTE :     [address]       [place] [address]             [phone]                 3 Days Post-Op Procedure(s) (LRB): REPAIR, MITRAL VALVE USING SIMUFORM SEMI-RIGID ANNULOPLASTY RING , CLOSURE OF PATENT FORAMEN OVALE (N/A) ECHOCARDIOGRAM, TRANSESOPHAGEAL, INTRAOPERATIVE (N/A)   Total Length of Stay:  LOS: 10 days  Events:   No events Stable day    BP 104/60   Pulse (!) 102   Temp 98.1 F (36.7 C) (Oral)   Resp 18   Ht 5\' 7"  (1.702 m)   Wt 98.1 kg   SpO2 95%   BMI 33.87 kg/m   CVP:  [7 mmHg-52 mmHg] 12 mmHg      epinephrine  Stopped (05/26/23 0940)   furosemide  (LASIX ) 200 mg in dextrose  5 % 100 mL (2 mg/mL) infusion 10 mg/hr (05/26/23 2100)   milrinone  0.25 mcg/kg/min (05/26/23 2100)   norepinephrine  (LEVOPHED ) Adult infusion Stopped (05/24/23 1543)   potassium chloride  100 mL/hr at 05/26/23 2100   vasopressin Stopped (05/26/23 0212)    I/O last 3 completed shifts: In: 824 [I.V.:494; Blood:330] Out: 5350 [Urine:5350]      Latest Ref Rng & Units 05/26/2023    4:00 AM 05/25/2023    3:33 PM 05/25/2023    5:00 AM  CBC  WBC 4.0 - 10.5 K/uL 14.2  14.4  16.0   Hemoglobin 13.0 - 17.0 g/dL 7.9  8.1  7.1   Hematocrit 39.0 - 52.0 % 23.5  24.8  21.9   Platelets 150 - 400 K/uL 65  59  51        Latest Ref Rng & Units 05/26/2023    4:07 PM 05/26/2023    4:00 AM 05/25/2023    3:33 PM  BMP  Glucose 70 - 99 mg/dL 914  782  956   BUN 8 - 23 mg/dL 34  29  28   Creatinine 0.61 - 1.24 mg/dL 2.13  0.86  5.78   Sodium 135 - 145 mmol/L 130  130  129   Potassium 3.5 - 5.1 mmol/L 3.4  3.2  3.8   Chloride 98 - 111 mmol/L 97  96  95   CO2 22 - 32 mmol/L 25  25  22    Calcium  8.9 - 10.3 mg/dL 7.7  8.0  8.0     ABG    Component Value Date/Time   PHART 7.375 05/23/2023 1957   PCO2ART 30.3 (L) 05/23/2023 1957   PO2ART 113 (H) 05/23/2023 1957   HCO3 17.7 (L) 05/23/2023 1957   TCO2 19 (L) 05/23/2023 1957   ACIDBASEDEF 7.0 (H) 05/23/2023  1957   O2SAT 67.1 05/26/2023 0520       Starleen Eastern, MD 05/26/2023 9:23 PM

## 2023-05-27 LAB — BASIC METABOLIC PANEL WITH GFR
Anion gap: 9 (ref 5–15)
Anion gap: 11 (ref 5–15)
BUN: 39 mg/dL — ABNORMAL HIGH (ref 8–23)
BUN: 37 mg/dL — ABNORMAL HIGH (ref 8–23)
CO2: 26 mmol/L (ref 22–32)
CO2: 28 mmol/L (ref 22–32)
Calcium: 8.3 mg/dL — ABNORMAL LOW (ref 8.9–10.3)
Calcium: 8.4 mg/dL — ABNORMAL LOW (ref 8.9–10.3)
Chloride: 98 mmol/L (ref 98–111)
Chloride: 94 mmol/L — ABNORMAL LOW (ref 98–111)
Creatinine, Ser: 2.63 mg/dL — ABNORMAL HIGH (ref 0.61–1.24)
Creatinine, Ser: 2.76 mg/dL — ABNORMAL HIGH (ref 0.61–1.24)
GFR, Estimated: 27 mL/min — ABNORMAL LOW (ref >60)
GFR, Estimated: 25 mL/min — ABNORMAL LOW (ref >60)
Glucose, Bld: 96 mg/dL (ref 70–99)
Glucose, Bld: 114 mg/dL — ABNORMAL HIGH (ref 70–99)
Potassium: 4.1 mmol/L (ref 3.5–5.1)
Potassium: 3.9 mmol/L (ref 3.5–5.1)
Sodium: 133 mmol/L — ABNORMAL LOW (ref 135–145)
Sodium: 133 mmol/L — ABNORMAL LOW (ref 135–145)

## 2023-05-27 LAB — COOXEMETRY PANEL
Carboxyhemoglobin: 2.1 % — ABNORMAL HIGH (ref 0.5–1.5)
Carboxyhemoglobin: 2.3 % — ABNORMAL HIGH (ref 0.5–1.5)
Methemoglobin: 0.9 % (ref 0.0–1.5)
Methemoglobin: <0.7 % (ref 0.0–1.5)
O2 Saturation: 98.1 %
O2 Saturation: 76.4 %
Total hemoglobin: 8.7 g/dL — ABNORMAL LOW (ref 12.0–16.0)
Total hemoglobin: 8.5 g/dL — ABNORMAL LOW (ref 12.0–16.0)

## 2023-05-27 LAB — GLUCOSE, CAPILLARY
Glucose-Capillary: 162 mg/dL — ABNORMAL HIGH (ref 70–99)
Glucose-Capillary: 66 mg/dL — ABNORMAL LOW (ref 70–99)
Glucose-Capillary: 114 mg/dL — ABNORMAL HIGH (ref 70–99)
Glucose-Capillary: 69 mg/dL — ABNORMAL LOW (ref 70–99)
Glucose-Capillary: 114 mg/dL — ABNORMAL HIGH (ref 70–99)
Glucose-Capillary: 139 mg/dL — ABNORMAL HIGH (ref 70–99)

## 2023-05-27 LAB — CBC
HCT: 26.5 % — ABNORMAL LOW (ref 39.0–52.0)
Hemoglobin: 8.5 g/dL — ABNORMAL LOW (ref 13.0–17.0)
MCH: 28 pg (ref 26.0–34.0)
MCHC: 32.1 g/dL (ref 30.0–36.0)
MCV: 87.2 fL (ref 80.0–100.0)
Platelets: 92 10*3/uL — ABNORMAL LOW (ref 150–400)
RBC: 3.04 MIL/uL — ABNORMAL LOW (ref 4.22–5.81)
RDW: 14.2 % (ref 11.5–15.5)
WBC: 13.7 10*3/uL — ABNORMAL HIGH (ref 4.0–10.5)
nRBC: 0 % (ref 0.0–0.2)

## 2023-05-27 MED ORDER — LACTULOSE 10 GM/15ML PO SOLN
20.0000 g | Freq: Every day | ORAL | Status: DC | PRN
Start: 2023-05-27 — End: 2023-06-02

## 2023-05-27 NOTE — Progress Notes (Signed)
      [address]       ,[place] [address]             [phone]       POD # 4 Mitral repair  Stable day  BP 101/62   Pulse (!) 112   Temp 99 F (37.2 C)   Resp 19   Ht 5\' 7"  (1.702 m)   Wt 97.4 kg   SpO2 98%   BMI 33.63 kg/m  Co-ox 76 on milrinone  0.25   Intake/Output Summary (Last 24 hours) at 05/27/2023 2015 Last data filed at 05/27/2023 1800 Gross per 24 hour  Intake 687.32 ml  Output 3225 ml  Net -2537.68 ml   Continues to diurese spontaneously with Lasix  drip off  Creatinine up slightly to 2.76  Landon Pinion C. Luna Salinas, MD [facility] [phone]

## 2023-05-27 NOTE — Progress Notes (Signed)
 4 Days Post-Op Procedure(s) (LRB): REPAIR, MITRAL VALVE USING SIMUFORM SEMI-RIGID ANNULOPLASTY RING , CLOSURE OF PATENT FORAMEN OVALE (N/A) ECHOCARDIOGRAM, TRANSESOPHAGEAL, INTRAOPERATIVE (N/A) Subjective: C/o feeling cold  Objective: Vital signs in last 24 hours: Temp:  [97.9 F (36.6 C)-98.6 F (37 C)] 98.4 F (36.9 C) (05/09 0300) Pulse Rate:  [102-113] 112 (05/09 0658) Cardiac Rhythm: Sinus tachycardia (05/09 0400) Resp:  [13-23] 20 (05/09 0658) BP: (85-120)/(51-79) 107/67 (05/09 0600) SpO2:  [91 %-98 %] 98 % (05/09 0658) Weight:  [97.4 kg] 97.4 kg (05/09 0109)  Hemodynamic parameters for last 24 hours: CVP:  [3 mmHg-52 mmHg] 3 mmHg  Intake/Output from previous day: 05/08 0701 - 05/09 0700 In: 809.2 [I.V.:267.5; IV Piggyback:541.7] Out: 3575 [Urine:3575] Intake/Output this shift: No intake/output data recorded.  General appearance: alert, cooperative, and no distress Neurologic: intact Heart: tachy, regular, no murmur Lungs: diminished breath sounds bibasilar Abdomen: normal findings: soft, non-tender Wound: clean and dry  Lab Results: Recent Labs    05/26/23 0400 05/27/23 0319  WBC 14.2* 13.7*  HGB 7.9* 8.5*  HCT 23.5* 26.5*  PLT 65* 92*   BMET:  Recent Labs    05/26/23 1607 05/27/23 0319  NA 130* 133*  K 3.4* 4.1  CL 97* 98  CO2 25 26  GLUCOSE 136* 96  BUN 34* 39*  CREATININE 2.18* 2.63*  CALCIUM  7.7* 8.3*    PT/INR: No results for input(s): "LABPROT", "INR" in the last 72 hours. ABG    Component Value Date/Time   PHART 7.375 05/23/2023 1957   HCO3 17.7 (L) 05/23/2023 1957   TCO2 19 (L) 05/23/2023 1957   ACIDBASEDEF 7.0 (H) 05/23/2023 1957   O2SAT 98.1 05/27/2023 0319   CBG (last 3)  Recent Labs    05/26/23 1146 05/26/23 1558 05/26/23 2134  GLUCAP 98 143* 86    Assessment/Plan: S/P Procedure(s) (LRB): REPAIR, MITRAL VALVE USING SIMUFORM SEMI-RIGID ANNULOPLASTY RING , CLOSURE OF PATENT FORAMEN OVALE (N/A) ECHOCARDIOGRAM,  TRANSESOPHAGEAL, INTRAOPERATIVE (N/A) POD # 4 NEURO- intact CV- sinus tachy  On milrinone  0.25- co-ox 98!- will repeat  CVP 9  ASA, beta blocker on hold, rosuvastatin  RESP- 2L [place] 99% sat  Continue IS RENAL- creatinine up to 2.6- c/w AKI  Stop Lasix  drip for now  [place] OK ENDO- CBG reasonably well controlled  Will stop Semglee  and monitor GI- + flatus but no BM  PRN lactulose Thrombocytopenia- gradually improving SCD + ambulation for DVT prophylaxis   LOS: 11 days    Zelphia Higashi 05/27/2023

## 2023-05-27 NOTE — Progress Notes (Addendum)
 Advanced Heart Failure Rounding Note  Cardiologist: Cody Das, MD  Chief Complaint: Heart Failure  Subjective:   5/5: S/P MVR . Extubated.  5/6: Hgb 7.6. Given 1UPRBC  Hgb post transfusion 7.9. MT removed.   5/7: Diuresed with IV lasix  and switched to lasix  drip. Given 1UPRBCs. Weaned off Vaso & Epi.   Net negative 2.8L with Lasix  10/hr. sCr continues to climb to 2.63 today. CVP 8 On milrinone  0.25 with Coox 76% Appear significantly volume up on PE. Weight remained up 30lbs from pre-op. ?intravascularly dry  Feeling tired this morning. Has been ambulating. No SOB.  Objective:    Weight Range: 97.4 kg Body mass index is 33.63 kg/m.   Vital Signs:   Temp:  [97.9 F (36.6 C)-98.6 F (37 C)] 98.5 F (36.9 C) (05/09 0727) Pulse Rate:  [102-113] 112 (05/09 0658) Resp:  [13-23] 20 (05/09 0658) BP: (85-120)/(51-79) 107/67 (05/09 0600) SpO2:  [91 %-98 %] 98 % (05/09 0658) Weight:  [97.4 kg] 97.4 kg (05/09 0109) Last BM Date : 05/19/23  Weight change: Filed Weights   05/25/23 0500 05/26/23 0500 05/27/23 0109  Weight: 98.9 kg 98.1 kg 97.4 kg   Intake/Output:  Intake/Output Summary (Last 24 hours) at 05/27/2023 0820 Last data filed at 05/27/2023 0600 Gross per 24 hour  Intake 794.85 ml  Output 3225 ml  Net -2430.15 ml   Physical Exam   CVP 8 General: Pale appearing. No distress on RA Cardiac: JVP to jaw on left. S1 and S2 present. No murmurs or rub. Extremities: Warm and dry.  1+ BLE and 1+ BUE edema.  Neuro: Alert and oriented x3. Affect pleasant. Moves all extremities without difficulty. Lines/Devices:  RUE PICC  Telemetry   ST 110s (personally reviewed)  EKG    ST 116 bpm  Labs    CBC Recent Labs    05/26/23 0400 05/27/23 0319  WBC 14.2* 13.7*  HGB 7.9* 8.5*  HCT 23.5* 26.5*  MCV 87.4 87.2  PLT 65* 92*   Basic Metabolic Panel Recent Labs    40/98/11 1711 05/25/23 0500 05/26/23 1607 05/27/23 0319  NA 132*   < > 130* 133*  K 4.2    < > 3.4* 4.1  CL 101   < > 97* 98  CO2 23   < > 25 26  GLUCOSE 105*   < > 136* 96  BUN 22   < > 34* 39*  CREATININE 1.08   < > 2.18* 2.63*  CALCIUM  8.1*   < > 7.7* 8.3*  MG 2.3  --   --   --    < > = values in this interval not displayed.   BNP (last 3 results) Recent Labs    04/18/23 1618  BNP 566.1*   Imaging    ECHOCARDIOGRAM COMPLETE Result Date: 05/26/2023    ECHOCARDIOGRAM REPORT   Patient Name:   Calvin Drake Date of Exam: 05/26/2023 Medical Rec #:  [id number]      Height:       67.0 in Accession #:    [id number]     Weight:       216.3 lb Date of Birth:  02-Feb-1960      BSA:          2.091 m Patient Age:    63 years       BP:           102/59 mmHg Patient Gender: M  HR:           105 bpm. Exam Location:  Inpatient Procedure: 2D Echo (Both Spectral and Color Flow Doppler were utilized during            procedure). Indications:    congestive heart failure  History:        Patient has prior history of Echocardiogram examinations, most                 recent 05/23/2023. CHF; PFO closure.                  Mitral Valve: 32 mm prosthetic annuloplasty ring valve is                 present in the mitral position. Procedure Date: 05/23/23.  Sonographer:    Dione Franks RDCS Referring Phys: [phone] AMY D CLEGG  Sonographer Comments: No subcostal window. Surgical dressings. IMPRESSIONS  1. Left ventricular ejection fraction, by estimation, is 45 to 50%. The left ventricle has mildly decreased function. The left ventricle has no regional wall motion abnormalities. There is mild concentric left ventricular hypertrophy. Left ventricular diastolic parameters are consistent with Grade II diastolic dysfunction (pseudonormalization).  2. Right ventricular systolic function is normal. The right ventricular size is normal.  3. Left atrial size was mildly dilated.  4. The mitral valve has been repaired/replaced. No evidence of mitral valve regurgitation. Mild mitral stenosis. The mean mitral valve  gradient is 5.0 mmHg. There is a 32 mm prosthetic annuloplasty ring present in the mitral position. Procedure Date: 05/23/23.  5. The aortic valve is tricuspid. Aortic valve regurgitation is not visualized. No aortic stenosis is present.  6. The inferior vena cava is normal in size with greater than 50% respiratory variability, suggesting right atrial pressure of 3 mmHg. FINDINGS  Left Ventricle: Left ventricular ejection fraction, by estimation, is 45 to 50%. The left ventricle has mildly decreased function. The left ventricle has no regional wall motion abnormalities. The left ventricular internal cavity size was normal in size. There is mild concentric left ventricular hypertrophy. Left ventricular diastolic parameters are consistent with Grade II diastolic dysfunction (pseudonormalization). Right Ventricle: The right ventricular size is normal. No increase in right ventricular wall thickness. Right ventricular systolic function is normal. Left Atrium: Left atrial size was mildly dilated. Right Atrium: Right atrial size was normal in size. Pericardium: There is no evidence of pericardial effusion. Mitral Valve: The mitral valve has been repaired/replaced. No evidence of mitral valve regurgitation. There is a 32 mm prosthetic annuloplasty ring present in the mitral position. Procedure Date: 05/23/23. Mild mitral valve stenosis. MV peak gradient, 13.5  mmHg. The mean mitral valve gradient is 5.0 mmHg. Tricuspid Valve: The tricuspid valve is normal in structure. Tricuspid valve regurgitation is mild . No evidence of tricuspid stenosis. Aortic Valve: The aortic valve is tricuspid. Aortic valve regurgitation is not visualized. No aortic stenosis is present. Pulmonic Valve: The pulmonic valve was normal in structure. Pulmonic valve regurgitation is trivial. No evidence of pulmonic stenosis. Aorta: The aortic root is normal in size and structure. Venous: The inferior vena cava is normal in size with greater than 50%  respiratory variability, suggesting right atrial pressure of 3 mmHg. IAS/Shunts: No atrial level shunt detected by color flow Doppler.  LEFT VENTRICLE PLAX 2D LVIDd:         4.70 cm LVIDs:         3.60 cm LV PW:  1.10 cm LV IVS:        1.20 cm LVOT diam:     2.30 cm LV SV:         57 LV SV Index:   27 LVOT Area:     4.15 cm  LV Volumes (MOD) LV vol d, MOD A4C: 71.9 ml LV vol s, MOD A4C: 41.8 ml LV SV MOD A4C:     71.9 ml RIGHT VENTRICLE RV Basal diam:  2.80 cm RV S prime:     8.27 cm/s TAPSE (M-mode): 1.4 cm LEFT ATRIUM             Index        RIGHT ATRIUM           Index LA diam:        4.10 cm 1.96 cm/m   RA Area:     14.40 cm LA Vol (A2C):   73.9 ml 35.35 ml/m  RA Volume:   34.40 ml  16.45 ml/m LA Vol (A4C):   63.0 ml 30.13 ml/m LA Biplane Vol: 70.5 ml 33.72 ml/m  AORTIC VALVE             PULMONIC VALVE LVOT Vmax:   100.00 cm/s PR End Diast Vel: 4.75 msec LVOT Vmean:  67.000 cm/s LVOT VTI:    0.138 m  AORTA Ao Root diam: 3.30 cm Ao Asc diam:  3.10 cm MITRAL VALVE                TRICUSPID VALVE MV Area (PHT): 2.95 cm     TR Peak grad:   30.0 mmHg MV Area VTI:   1.56 cm     TR Vmax:        274.00 cm/s MV Peak grad:  13.5 mmHg MV Mean grad:  5.0 mmHg     SHUNTS MV Vmax:       1.84 m/s     Systemic VTI:  0.14 m MV Vmean:      107.0 cm/s   Systemic Diam: 2.30 cm MV Decel Time: 257 msec MV E velocity: 169.00 cm/s MV A velocity: 88.50 cm/s MV E/A ratio:  1.91 Maudine Sos MD Electronically signed by Maudine Sos MD Signature Date/Time: 05/26/2023/4:42:21 PM    Final    Medications:    Scheduled Medications:  sodium chloride    Intravenous Once   acetaminophen   1,000 mg Oral Q6H   Or   acetaminophen  (TYLENOL ) oral liquid 160 mg/5 mL  1,000 mg Per Tube Q6H   aspirin  EC  325 mg Oral Daily   Or   aspirin   324 mg Per Tube Daily   bisacodyl   10 mg Oral Daily   Or   bisacodyl   10 mg Rectal Daily   Chlorhexidine  Gluconate Cloth  6 each Topical Daily   cyclobenzaprine   5 mg Oral QHS    docusate sodium   200 mg Oral Daily   insulin  aspart  0-15 Units Subcutaneous TID WC   insulin  aspart  0-5 Units Subcutaneous QHS   insulin  aspart  5 Units Subcutaneous TID WC   melatonin  3 mg Oral QHS   pantoprazole   40 mg Oral Daily   rosuvastatin   40 mg Oral Daily   sodium chloride  flush  10-40 mL Intracatheter Q12H   sodium chloride  flush  3 mL Intravenous Q12H   Infusions:  milrinone  0.25 mcg/kg/min (05/27/23 0600)   norepinephrine  (LEVOPHED ) Adult infusion Stopped (05/24/23 1543)   vasopressin  Stopped (05/26/23 0212)   PRN Medications: dextrose , lactulose,  metoprolol  tartrate, midazolam , morphine  injection, ondansetron  (ZOFRAN ) IV, oxyCODONE , sodium chloride  flush, sodium chloride  flush, traMADol   Patient Profile   Calvin Drake is a 63 y.o. male with history of severe MR. Admitted for workup/management after TEE and R/LHC. 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 Intra Op Echo EF 30-35%.  - On milrinone  0.25 mcg. Coox 76. CVP 8. - Continue to ambulate - No MR on echo post-op   2. 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  - Echo 4/25: EF 60-65%, grade II DD, septum flattened in systole and diastole c/w RV pressure and volume overload, RVSP 55 mmHg, severe BAE, severe MR Echo  - TEE 4/25: EF 50-55%. RV severely reduced. 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  - Repeat RHC 05/20/23- low filling pressures; RA 1, PCWP 14-16. Cardiac index remains reduced by TD despite addition of milrinone  0.25mcg/kg/min - Post-op Echo 05/26/23: EF 45-50%, G2DD, RV nl. - Continue Milrinone  0.25 mcg.  - Lasix  gtt stopped d/t rising Cr. Will need additional volume removal eventually, will hold today. Suspect extravascular>intravascular. Repeat BMET this evening.   3. Anemia, Expected Blood Loss - 1u RBCs 5/6 and 5/7 - Hgb 7.9>8.5 (suspect hemoconcentration  with low intravascular volume)  4. CAD - nonobstructive CAD by LHC - continue statin.   Length of Stay: 11  CRITICAL CARE Performed by: [place] Lee  Total critical care time: 12 minutes  Critical care time was exclusive of separately billable procedures and treating other patients.  Critical care was necessary to treat or prevent imminent or life-threatening deterioration.  Critical care was time spent personally by me on the following activities: development of treatment plan with patient and/or surrogate as well as nursing, discussions with consultants, evaluation of patient's response to treatment, examination of patient, obtaining history from patient or surrogate, ordering and performing treatments and interventions, ordering and review of laboratory studies, ordering and review of radiographic studies, pulse oximetry and re-evaluation of patient's condition.  [place] Lee, NP  05/27/2023, 8:20 AM  Advanced Heart Failure Team Pager [phone] (M-F; 7a - 5p)  Please contact [facility] for night-coverage after hours (5p -7a ) and weekends on [email or web address]  Patient seen with NP, I formulated the plan and agree with the above note.   Good diuresis yesterday on Lasix  gtt, I/Os net negative 2755.  CVP still 12 on my read.  Co-ox 76% on milrinone  0.25.  However, creatinine higher at 2.63.   Echo with EF 45-50%, RV normal, s/p MV repair with no MR and mean gradient 5.   Telemetry looks like sinus tachy 100s.   General: NAD Neck: JVP 10-12 cm, no thyromegaly or thyroid nodule.  Lungs: Clear to auscultation bilaterally with normal respiratory effort. CV: Nondisplaced PMI.  Heart regular S1/S2, no S3/S4, no murmur.  1+ edema 1/2 to knees.  Abdomen: Soft, nontender, no hepatosplenomegaly, no distention.  Skin: Intact without lesions or rashes.  Neurologic: Alert and oriented x 3.  Psych: Normal affect. Extremities: No clubbing or cyanosis.  HEENT: Normal.   Now s/p MV repair with  annuloplasty ring and PFO closure. Post-op echo with EF 45-50%, RV normal, s/p MV repair with no MR and mean gradient 5, no pericardial effusion. Good diuresis yesterday with Lasix  gtt 10 mg/hr.  Still volume overloaded on exam with CVP 12 and weight still significantly above pre-op, but creatinine up  to 2.63 today. Good co-ox 76%. - Continue milrinone  0.25 for now.   - Stop Lasix  gtt for now to allow him to re-equilibrate with AKI.    Hgb stable 8.5.   CRITICAL CARE Performed by: Peder Bourdon  Total critical care time: 35 minutes  Critical care time was exclusive of separately billable procedures and treating other patients.  Critical care was necessary to treat or prevent imminent or life-threatening deterioration.  Critical care was time spent personally by me on the following activities: development of treatment plan with patient and/or surrogate as well as nursing, discussions with consultants, evaluation of patient's response to treatment, examination of patient, obtaining history from patient or surrogate, ordering and performing treatments and interventions, ordering and review of laboratory studies, ordering and review of radiographic studies, pulse oximetry and re-evaluation of patient's condition.  Peder Bourdon 05/27/2023 10:49 AM

## 2023-05-27 NOTE — Evaluation (Signed)
 Occupational Therapy Evaluation Patient Details Name: Calvin Drake MRN: [id number] DOB: Mar 16, 1960 Today's Date: 05/27/2023   History of Present Illness   63 y.o. male presents to [facility] 05/16/23 with exertional SOB and fatigue. Pt with severe MR and moderate CAD. 4/28 R/L heart cath and angiography. 5/2 R heart cath. 5/5 MVR and closure of patent foramen ovale. 5/6 given 1UPRBC. PMHx: CHF, CAD     Clinical Impressions At baseline, pt is Independent with ADLs, IADLs, and functional mobility without an AD, and drives and works. Pt now presents with decreased knowledge of precautions, decreased activity tolerance, decreased balance, decreased cognition, noted decreased Left eye convergence and additional eye shifts, impaired functional vision, decreased balance, impaired cardiopulmonary status, and decreased safety and independence with functional tasks. Pt currently demonstrates ability to complete UB ADLs Independent to Min assist, LB ADLs with Min to Mod assist, and functional mobility/transfers with an EVA walker with Contact guard assist, all while adhering to sternal precautions. Pt requires cues to adhere to sternal precautions, for safety, and to attend to obstacles on pt's Left side during functional mobility. HR in the 110s to low-120s and O2 sat >/95% on 2L continuous O2 through nasal cannula throughout session. Pt will benefit from acute skilled OT services to address deficits outlined below and to increase safety and independence with functional tasks. OT recommends Cardiac Rehab following acute discharge. No post-acute skilled OT needs are anticipated at this time.      If plan is discharge home, recommend the following:   A little help with walking and/or transfers;A lot of help with bathing/dressing/bathroom;Assistance with cooking/housework;Assist for transportation;Help with stairs or ramp for entrance     Functional Status Assessment   Patient has had a recent decline in their  functional status and demonstrates the ability to make significant improvements in function in a reasonable and predictable amount of time.     Equipment Recommendations   Other (comment) (RW)     Recommendations for Other Services         Precautions/Restrictions   Precautions Precautions: Fall;Sternal Precaution Booklet Issued: Yes (comment) Recall of Precautions/Restrictions: Impaired Precaution/Restrictions Comments: Pt able to recall no pushing and hugging heart pillow during transfers and coughing. Pt requiring cues for no pulling, no lifting heavy objects, and moving in the tube. Restrictions Other Position/Activity Restrictions: sternal precautions     Mobility Bed Mobility Overal bed mobility: Needs Assistance Bed Mobility: Sidelying to Sit, Sit to Sidelying   Sidelying to sit: Min assist, Mod assist, HOB elevated (huggin heart pillow)     Sit to sidelying: Min assist, Mod assist, HOB elevated (hugging heart pillow) General bed mobility comments: assist to bring hips to EOB with bed pad; assist managing B LE in/out of bed; cues for technique    Transfers Overall transfer level: Needs assistance Equipment used:  (EVA walker) Transfers: Sit to/from Stand, Bed to chair/wheelchair/BSC Sit to Stand: Contact guard assist     Step pivot transfers: Contact guard assist     General transfer comment: cues for safety and for attention to obstacles on Left side      Balance Overall balance assessment: Needs assistance Sitting-balance support: No upper extremity supported, Feet supported Sitting balance-Leahy Scale: Fair     Standing balance support: No upper extremity supported, Single extremity supported, Bilateral upper extremity supported, During functional activity Standing balance-Leahy Scale: Fair Standing balance comment: able to stand statically with no AD  ADL either performed or assessed with clinical judgement    ADL Overall ADL's : Needs assistance/impaired Eating/Feeding: Independent;Sitting   Grooming: Set up;Supervision/safety;Cueing for compensatory techniques;Sitting (cues to adhere to sternal precautions)   Upper Body Bathing: Contact guard assist;Minimal assistance;Cueing for compensatory techniques;Sitting   Lower Body Bathing: Minimal assistance;Moderate assistance;Cueing for compensatory techniques;Sitting/lateral leans;Sit to/from stand (cues to adhere to sternal precautions)   Upper Body Dressing : Contact guard assist;Minimal assistance;Sitting;Cueing for compensatory techniques (cues to adhere to sternal precautions)   Lower Body Dressing: Moderate assistance;Cueing for compensatory techniques;Sitting/lateral leans;Sit to/from stand (cues to adhere to sternal precautions)   Toilet Transfer: Contact guard assist;Cueing for safety;Ambulation;BSC/3in1 (EVA walker) Statistician Details (indicate cue type and reason): simulated Toileting- Clothing Manipulation and Hygiene: Moderate assistance;Cueing for compensatory techniques;Sitting/lateral lean;Sit to/from stand (cues to adhere to sternal precautions)       Functional mobility during ADLs: Contact guard assist;Cueing for safety (EVA walker; cues for attention to obstacles on Left side)       Vision Baseline Vision/History: 0 No visual deficits (per pt report) Ability to See in Adequate Light: 0 Adequate (per pt report) Patient Visual Report: No change from baseline Vision Assessment?: Yes Eye Alignment: Within Functional Limits Ocular Range of Motion: Within Functional Limits Alignment/Gaze Preference: Within Defined Limits Tracking/Visual Pursuits: Requires cues, head turns, or add eye shifts to track;Able to track stimulus in all quads without difficulty (on Left - increased eye shifts; no noted deficits in Right eye) Saccades: Additional eye shifts occurred during testing;Within functional limits (additional eye shifts  in Left eye; R WFL) Convergence: Impaired (comment) (WFL on R; impaired on Left) Visual Fields: No apparent deficits Additional Comments: Pt with mild inattention to Left side when ambulating with Marlea Silvers walker with pt requiring cues to navigate around objects on Left side. Without cues, pt bumping into closest corner of obstacles (chair, laundry cart, person) on Left side x3. This may be pt's baseline as pt reports he is "extremely right eye dominant" and pt and wife report pt "trips up the stairs" about once every six months if he is in a hurry or not watching where he is going. Pt further reporting he "always sees halos" and "fuzziness" around objects when taking "strong medication," such as pain medication. RN notified.     Perception         Praxis         Pertinent Vitals/Pain Pain Assessment Pain Assessment: Faces Faces Pain Scale: Hurts a little bit Pain Location: sternum at site of incision, worse with eating cold foods or with cold drinks Pain Descriptors / Indicators: Sore, Discomfort, Burning, Tingling Pain Intervention(s): Limited activity within patient's tolerance, Monitored during session     Extremity/Trunk Assessment Upper Extremity Assessment Upper Extremity Assessment: Right hand dominant;Overall WFL for tasks assessed (tested within sternal precautions)   Lower Extremity Assessment Lower Extremity Assessment: Defer to PT evaluation   Cervical / Trunk Assessment Cervical / Trunk Assessment: Other exceptions Cervical / Trunk Exceptions: sternal incision   Communication Communication Communication: No apparent difficulties   Cognition Arousal: Alert Behavior During Therapy: WFL for tasks assessed/performed, Flat affect (Largely WFL with intermittent flat affect) Cognition: No apparent impairments             OT - Cognition Comments: AAOx4 and pleasant throughout session. Pt cognition WFL for tasks assessed. Cognition not formally screened or assessed.                  Following commands: Intact  Cueing  General Comments   Cueing Techniques: Verbal cues  HR in the 110s to low-120s and O2 sat >/95% on 2L continuous O2 through nasal cannula throughout session. Pt's wife present throughout session. pt's friend and RN each present during a portion of session.   Exercises     Shoulder Instructions      Home Living Family/patient expects to be discharged to:: Private residence Living Arrangements: Spouse/significant other Available Help at Discharge: Family;Available 24 hours/day Type of Home: House Home Access: Level entry     Home Layout: Two level;Able to live on main level with bedroom/bathroom Alternate Level Stairs-Number of Steps: flight   Bathroom Shower/Tub: Producer, television/film/video: Handicapped height     Home Equipment: Shower seat;Hand held shower head          Prior Functioning/Environment Prior Level of Function : Independent/Modified Independent;Driving;History of Falls (last six months)             Mobility Comments: Ind with no AD ADLs Comments: Ind with ADLs and IADLs; drives, works as a Proofreader; enjoys fishing, hunting, and target shooting    OT Problem List: Decreased activity tolerance;Impaired balance (sitting and/or standing);Impaired vision/perception;Decreased knowledge of use of DME or AE;Decreased knowledge of precautions   OT Treatment/Interventions: Self-care/ADL training;Energy conservation;DME and/or AE instruction;Therapeutic activities;Visual/perceptual remediation/compensation;Patient/family education;Balance training      OT Goals(Current goals can be found in the care plan section)   Acute Rehab OT Goals Patient Stated Goal: to return home and go back to work OT Goal Formulation: With patient/family Time For Goal Achievement: 06/10/23 Potential to Achieve Goals: Good ADL Goals Pt Will Perform Grooming: with modified independence;standing (adhering to sternal  precautions) Pt Will Perform Upper Body Bathing: with set-up;sitting (adhering to sternal precautions) Pt Will Perform Lower Body Bathing: with contact guard assist;sitting/lateral leans;sit to/from stand (adhering to sternal precautions) Pt Will Perform Upper Body Dressing: with modified independence;sitting (adhering to sternal precautions) Pt Will Perform Lower Body Dressing: with supervision;sitting/lateral leans;sit to/from stand (adhering to sternal precautions) Pt Will Transfer to Toilet: with modified independence;ambulating;regular height toilet (with least restrictive AD; adhering to sternal precautions) Pt Will Perform Toileting - Clothing Manipulation and hygiene: with supervision;sitting/lateral leans;sit to/from stand (adhering to sternal precautions)   OT Frequency:  Min 2X/week    Co-evaluation              AM-PAC OT "6 Clicks" Daily Activity     Outcome Measure Help from another person eating meals?: None Help from another person taking care of personal grooming?: A Little Help from another person toileting, which includes using toliet, bedpan, or urinal?: A Lot Help from another person bathing (including washing, rinsing, drying)?: A Lot Help from another person to put on and taking off regular upper body clothing?: A Little Help from another person to put on and taking off regular lower body clothing?: A Lot 6 Click Score: 16   End of Session Equipment Utilized During Treatment: Oxygen;Other (comment);Gait belt (EVA walker) Nurse Communication: Mobility status  Activity Tolerance: Patient tolerated treatment well Patient left: in bed;with call bell/phone within reach;with family/visitor present  OT Visit Diagnosis: Unsteadiness on feet (R26.81);Other abnormalities of gait and mobility (R26.89);Other (comment) (decreased activity tolerance)                Time: 1610-9604 OT Time Calculation (min): 52 min Charges:  OT Evaluation $OT Eval Low Complexity: 1  Low OT Treatments $Self Care/Home Management : 23-37 mins  Taletha Twiford "Kyle" M., OTR/L,  MA Acute Rehab [phone]  Walt Gunner 05/27/2023, 10:57 PM

## 2023-05-28 ENCOUNTER — Inpatient Hospital Stay (HOSPITAL_COMMUNITY): Payer: Self-pay

## 2023-05-28 LAB — BASIC METABOLIC PANEL WITH GFR
Anion gap: 7 (ref 5–15)
BUN: 36 mg/dL — ABNORMAL HIGH (ref 8–23)
CO2: 28 mmol/L (ref 22–32)
Calcium: 8 mg/dL — ABNORMAL LOW (ref 8.9–10.3)
Chloride: 98 mmol/L (ref 98–111)
Creatinine, Ser: 2.84 mg/dL — ABNORMAL HIGH (ref 0.61–1.24)
GFR, Estimated: 24 mL/min — ABNORMAL LOW (ref >60)
Glucose, Bld: 100 mg/dL — ABNORMAL HIGH (ref 70–99)
Potassium: 3.4 mmol/L — ABNORMAL LOW (ref 3.5–5.1)
Sodium: 133 mmol/L — ABNORMAL LOW (ref 135–145)

## 2023-05-28 LAB — CBC
HCT: 25.6 % — ABNORMAL LOW (ref 39.0–52.0)
Hemoglobin: 8.5 g/dL — ABNORMAL LOW (ref 13.0–17.0)
MCH: 29.3 pg (ref 26.0–34.0)
MCHC: 33.2 g/dL (ref 30.0–36.0)
MCV: 88.3 fL (ref 80.0–100.0)
Platelets: 108 10*3/uL — ABNORMAL LOW (ref 150–400)
RBC: 2.9 MIL/uL — ABNORMAL LOW (ref 4.22–5.81)
RDW: 14.2 % (ref 11.5–15.5)
WBC: 10.7 10*3/uL — ABNORMAL HIGH (ref 4.0–10.5)
nRBC: 0 % (ref 0.0–0.2)

## 2023-05-28 LAB — GLUCOSE, CAPILLARY
Glucose-Capillary: 96 mg/dL (ref 70–99)
Glucose-Capillary: 104 mg/dL — ABNORMAL HIGH (ref 70–99)
Glucose-Capillary: 112 mg/dL — ABNORMAL HIGH (ref 70–99)
Glucose-Capillary: 188 mg/dL — ABNORMAL HIGH (ref 70–99)

## 2023-05-28 LAB — COOXEMETRY PANEL
Carboxyhemoglobin: 2 % — ABNORMAL HIGH (ref 0.5–1.5)
Methemoglobin: <0.7 % (ref 0.0–1.5)
O2 Saturation: 68.9 %
Total hemoglobin: 8.7 g/dL — ABNORMAL LOW (ref 12.0–16.0)

## 2023-05-28 MED ORDER — POTASSIUM CHLORIDE 10 MEQ/50ML IV SOLN
10.0000 meq | INTRAVENOUS | Status: AC
  Administered 2023-05-28 (×2): 10 meq via INTRAVENOUS
  Filled 2023-05-28 (×2): qty 50

## 2023-05-28 MED ORDER — MILRINONE LACTATE IN DEXTROSE 20-5 MG/100ML-% IV SOLN
0.1250 ug/kg/min | INTRAVENOUS | Status: DC
  Administered 2023-05-29: 0.125 ug/kg/min via INTRAVENOUS
  Filled 2023-05-28: qty 100

## 2023-05-28 MED ORDER — POTASSIUM CHLORIDE CRYS ER 20 MEQ PO TBCR
20.0000 meq | EXTENDED_RELEASE_TABLET | ORAL | Status: DC
  Filled 2023-05-28: qty 1

## 2023-05-28 MED ORDER — POTASSIUM CHLORIDE 10 MEQ/50ML IV SOLN
10.0000 meq | INTRAVENOUS | Status: AC
  Administered 2023-05-28 (×3): 10 meq via INTRAVENOUS
  Filled 2023-05-28 (×3): qty 50

## 2023-05-28 MED ORDER — SORBITOL 70 % SOLN
45.0000 mL | Freq: Once | Status: AC
  Administered 2023-05-28: 45 mL via ORAL
  Filled 2023-05-28: qty 60

## 2023-05-28 MED ORDER — POTASSIUM CHLORIDE CRYS ER 20 MEQ PO TBCR
20.0000 meq | EXTENDED_RELEASE_TABLET | Freq: Once | ORAL | Status: DC
  Filled 2023-05-28: qty 1

## 2023-05-28 MED ORDER — POLYETHYLENE GLYCOL 3350 17 G PO PACK
17.0000 g | PACK | Freq: Every day | ORAL | Status: DC
  Administered 2023-05-28 – 2023-05-31 (×2): 17 g via ORAL
  Filled 2023-05-28 (×4): qty 1

## 2023-05-28 NOTE — Progress Notes (Signed)
 5 Days Post-Op Procedure(s) (LRB): REPAIR, MITRAL VALVE USING SIMUFORM SEMI-RIGID ANNULOPLASTY RING , CLOSURE OF PATENT FORAMEN OVALE (N/A) ECHOCARDIOGRAM, TRANSESOPHAGEAL, INTRAOPERATIVE (N/A) Subjective: Up in chair + BM today  Objective: Vital signs in last 24 hours: Temp:  [97.9 F (36.6 C)-99.1 F (37.3 C)] 98.4 F (36.9 C) (05/10 1608) Pulse Rate:  [108-119] 115 (05/10 1700) Cardiac Rhythm: Sinus tachycardia (05/10 1200) Resp:  [14-25] 24 (05/10 1700) BP: (88-125)/(51-82) 125/79 (05/10 1700) SpO2:  [92 %-96 %] 95 % (05/10 1700)  Hemodynamic parameters for last 24 hours: CVP:  [4 mmHg-12 mmHg] 12 mmHg  Intake/Output from previous day: 05/09 0701 - 05/10 0700 In: 168.1 [I.V.:154.6; IV Piggyback:13.5] Out: 3450 [Urine:3450] Intake/Output this shift: Total I/O In: 156 [I.V.:34.2; IV Piggyback:121.8] Out: 400 [Urine:400]  General appearance: alert, cooperative, and no distress Neurologic: intact Heart: tachy, regular Lungs: diminished breath sounds bibasilar and improved Abdomen: normal findings: soft, non-tender  Lab Results: Recent Labs    05/27/23 0319 05/28/23 0433  WBC 13.7* 10.7*  HGB 8.5* 8.5*  HCT 26.5* 25.6*  PLT 92* 108*   BMET:  Recent Labs    05/27/23 1400 05/28/23 0433  NA 133* 133*  K 3.9 3.4*  CL 94* 98  CO2 28 28  GLUCOSE 114* 100*  BUN 37* 36*  CREATININE 2.76* 2.84*  CALCIUM  8.4* 8.0*    PT/INR: No results for input(s): "LABPROT", "INR" in the last 72 hours. ABG    Component Value Date/Time   PHART 7.375 05/23/2023 1957   HCO3 17.7 (L) 05/23/2023 1957   TCO2 19 (L) 05/23/2023 1957   ACIDBASEDEF 7.0 (H) 05/23/2023 1957   O2SAT 68.9 05/28/2023 0433   CBG (last 3)  Recent Labs    05/28/23 0636 05/28/23 1141 05/28/23 1610  GLUCAP 96 104* 112*    Assessment/Plan: S/P Procedure(s) (LRB): REPAIR, MITRAL VALVE USING SIMUFORM SEMI-RIGID ANNULOPLASTY RING , CLOSURE OF PATENT FORAMEN OVALE (N/A) ECHOCARDIOGRAM,  TRANSESOPHAGEAL, INTRAOPERATIVE (N/A) POD # 3 NEURO- intact CV- in sinus tachy   CVP 6 Co-ox 69  Milrinone  decreased to 0.125 RESP_ off O2  Continue IS RENAL- creatinine up slightly to 2.84  Diuresing spontaneously  C/w nonoliguric phase of AKI ENDO- CBG well controlled GI- + BM, appetite improved SCD + ambulation, no enoxaparin due to thrombocytopenia which is improving   LOS: 12 days    Calvin Drake 05/28/2023

## 2023-05-28 NOTE — Progress Notes (Signed)
 Patient ID: Calvin Drake, male   DOB: March 07, 1960, 63 y.o.   MRN: [id number]     Advanced Heart Failure Rounding Note  Cardiologist: Cody Das, MD  Chief Complaint: Heart Failure  Subjective:    5/5: S/P MVR . Extubated.  5/6: Hgb 7.6. Given 1UPRBC  Hgb post transfusion 7.9. MT removed.   5/7: Diuresed with IV lasix  and switched to lasix  drip. Given 1UPRBCs. Weaned off Vaso & Epi.   Lasix  stopped with AKI, creatinine stable 2.76 => 2.84.  However, patient with excellent UOP, I/Os net negative 3282.  Weight down 2 lbs.  CVP 12, co-ox 69% on milrinone  0.25.   Feels better, has been walking around unit.   Objective:    Weight Range: 97.4 kg Body mass index is 33.63 kg/m.   Vital Signs:   Temp:  [98.1 F (36.7 C)-99.1 F (37.3 C)] 98.8 F (37.1 C) (05/10 0700) Pulse Rate:  [108-119] 119 (05/10 0700) Resp:  [14-25] 25 (05/10 0700) BP: (88-125)/(51-82) 125/82 (05/10 0700) SpO2:  [92 %-98 %] 94 % (05/10 0700) Last BM Date : 05/19/23  Weight change: Filed Weights   05/25/23 0500 05/26/23 0500 05/27/23 0109  Weight: 98.9 kg 98.1 kg 97.4 kg   Intake/Output:  Intake/Output Summary (Last 24 hours) at 05/28/2023 1013 Last data filed at 05/28/2023 0700 Gross per 24 hour  Intake 145.68 ml  Output 2475 ml  Net -2329.32 ml   Physical Exam   CVP 12 General: NAD Neck: JVP 12 cm, no thyromegaly or thyroid nodule.  Lungs: Clear to auscultation bilaterally with normal respiratory effort. CV: Nondisplaced PMI.  Heart regular S1/S2, no S3/S4, no murmur.  1+ ankle edema.   Abdomen: Soft, nontender, no hepatosplenomegaly, no distention.  Skin: Intact without lesions or rashes.  Neurologic: Alert and oriented x 3.  Psych: Normal affect. Extremities: No clubbing or cyanosis.  HEENT: Normal.   Telemetry   ST 110 (personally reviewed)  Labs    CBC Recent Labs    05/27/23 0319 05/28/23 0433  WBC 13.7* 10.7*  HGB 8.5* 8.5*  HCT 26.5* 25.6*  MCV 87.2 88.3  PLT 92*  108*   Basic Metabolic Panel Recent Labs    95/28/41 1400 05/28/23 0433  NA 133* 133*  K 3.9 3.4*  CL 94* 98  CO2 28 28  GLUCOSE 114* 100*  BUN 37* 36*  CREATININE 2.76* 2.84*  CALCIUM  8.4* 8.0*   BNP (last 3 results) Recent Labs    04/18/23 1618  BNP 566.1*   Imaging    No results found.  Medications:    Scheduled Medications:  sodium chloride    Intravenous Once   acetaminophen   1,000 mg Oral Q6H   Or   acetaminophen  (TYLENOL ) oral liquid 160 mg/5 mL  1,000 mg Per Tube Q6H   aspirin  EC  325 mg Oral Daily   Or   aspirin   324 mg Per Tube Daily   bisacodyl   10 mg Oral Daily   Or   bisacodyl   10 mg Rectal Daily   Chlorhexidine  Gluconate Cloth  6 each Topical Daily   cyclobenzaprine   5 mg Oral QHS   docusate sodium   200 mg Oral Daily   insulin  aspart  0-15 Units Subcutaneous TID WC   insulin  aspart  0-5 Units Subcutaneous QHS   insulin  aspart  5 Units Subcutaneous TID WC   melatonin  3 mg Oral QHS   pantoprazole   40 mg Oral Daily   rosuvastatin   40 mg Oral Daily  sodium chloride  flush  10-40 mL Intracatheter Q12H   sodium chloride  flush  3 mL Intravenous Q12H   Infusions:  milrinone      norepinephrine  (LEVOPHED ) Adult infusion Stopped (05/24/23 1543)   vasopressin  Stopped (05/26/23 0212)   PRN Medications: dextrose , lactulose, midazolam , morphine  injection, ondansetron  (ZOFRAN ) IV, oxyCODONE , sodium chloride  flush, sodium chloride  flush, traMADol   Patient Profile   Calvin Drake is a 63 y.o. male with history of severe MR. Admitted for workup/management after TEE and R/LHC. 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  - Echo 4/25: EF 60-65%, grade II DD, septum flattened in systole and diastole c/w RV pressure and volume overload, RVSP 55 mmHg, severe BAE, severe MR Echo  - TEE 4/25: EF 50-55%. RV severely reduced. Severe MR with  multiple ruptured chordae and severe flail of P3 scallop with involvement of P2 scallop.  - Repeat RHC 05/20/23- low filling pressures; RA 1, PCWP 14-16. Cardiac index remains reduced by TD despite addition of milrinone  0.25mcg/kg/min - Post-op Echo 05/26/23: EF 45-50%, G2DD, RV nl. - Lasix  gtt stopped with AKI despite ongoing volume overload.  Today, creatinine has stabilized and he has good UOP with no diuretic, weight down 2 lbs. CVP remains 12 with weight significantly up from baseline.  Continue to hold IV Lasix  and follow UOP as he autodiureses.  - Decrease milrinone  to 0.125.   3. Anemia, Expected Blood Loss - 1u RBCs 5/6 and 5/7 - Hgb 7.9>8.5 (suspect hemoconcentration with low intravascular volume)  4. CAD - nonobstructive CAD by LHC - continue statin.   5. AKI - Creatinine plateaued today 2.76 => 2.84.  With good autodiuresis, hopefully will start to improve tomorrow.   Length of Stay: 12  CRITICAL CARE Performed by: Peder Bourdon  Total critical care time: 35 minutes  Critical care time was exclusive of separately billable procedures and treating other patients.  Critical care was necessary to treat or prevent imminent or life-threatening deterioration.  Critical care was time spent personally by me on the following activities: development of treatment plan with patient and/or surrogate as well as nursing, discussions with consultants, evaluation of patient's response to treatment, examination of patient, obtaining history from patient or surrogate, ordering and performing treatments and interventions, ordering and review of laboratory studies, ordering and review of radiographic studies, pulse oximetry and re-evaluation of patient's condition.  Peder Bourdon 05/28/2023 10:13 AM

## 2023-05-29 LAB — CBC
HCT: 24.9 % — ABNORMAL LOW (ref 39.0–52.0)
Hemoglobin: 7.9 g/dL — ABNORMAL LOW (ref 13.0–17.0)
MCH: 28.2 pg (ref 26.0–34.0)
MCHC: 31.7 g/dL (ref 30.0–36.0)
MCV: 88.9 fL (ref 80.0–100.0)
Platelets: 112 10*3/uL — ABNORMAL LOW (ref 150–400)
RBC: 2.8 MIL/uL — ABNORMAL LOW (ref 4.22–5.81)
RDW: 14.4 % (ref 11.5–15.5)
WBC: 11.3 10*3/uL — ABNORMAL HIGH (ref 4.0–10.5)
nRBC: 0 % (ref 0.0–0.2)

## 2023-05-29 LAB — BASIC METABOLIC PANEL WITH GFR
Anion gap: 10 (ref 5–15)
BUN: 37 mg/dL — ABNORMAL HIGH (ref 8–23)
CO2: 23 mmol/L (ref 22–32)
Calcium: 7.8 mg/dL — ABNORMAL LOW (ref 8.9–10.3)
Chloride: 103 mmol/L (ref 98–111)
Creatinine, Ser: 2.34 mg/dL — ABNORMAL HIGH (ref 0.61–1.24)
GFR, Estimated: 31 mL/min — ABNORMAL LOW (ref >60)
Glucose, Bld: 100 mg/dL — ABNORMAL HIGH (ref 70–99)
Potassium: 3.8 mmol/L (ref 3.5–5.1)
Sodium: 136 mmol/L (ref 135–145)

## 2023-05-29 LAB — GLUCOSE, CAPILLARY
Glucose-Capillary: 79 mg/dL (ref 70–99)
Glucose-Capillary: 104 mg/dL — ABNORMAL HIGH (ref 70–99)
Glucose-Capillary: 117 mg/dL — ABNORMAL HIGH (ref 70–99)
Glucose-Capillary: 135 mg/dL — ABNORMAL HIGH (ref 70–99)

## 2023-05-29 LAB — COOXEMETRY PANEL
Carboxyhemoglobin: 1.9 % — ABNORMAL HIGH (ref 0.5–1.5)
Methemoglobin: 2.1 % — ABNORMAL HIGH (ref 0.0–1.5)
O2 Saturation: 77.8 %
Total hemoglobin: 8.5 g/dL — ABNORMAL LOW (ref 12.0–16.0)

## 2023-05-29 MED ORDER — CALCIUM CARBONATE ANTACID 500 MG PO CHEW
1.0000 | CHEWABLE_TABLET | Freq: Two times a day (BID) | ORAL | Status: DC | PRN
  Administered 2023-06-01: 200 mg via ORAL
  Filled 2023-05-29: qty 1

## 2023-05-29 NOTE — Plan of Care (Signed)
  Problem: Education: Goal: Understanding of CV disease, CV risk reduction, and recovery process will improve Outcome: Progressing Goal: Individualized Educational Video(s) Outcome: Progressing   Problem: Activity: Goal: Ability to return to baseline activity level will improve Outcome: Progressing   Problem: Cardiovascular: Goal: Ability to achieve and maintain adequate cardiovascular perfusion will improve Outcome: Progressing   Problem: Health Behavior/Discharge Planning: Goal: Ability to safely manage health-related needs after discharge will improve Outcome: Progressing   Problem: Education: Goal: Knowledge of General Education information will improve Description: Including pain rating scale, medication(s)/side effects and non-pharmacologic comfort measures Outcome: Progressing   Problem: Health Behavior/Discharge Planning: Goal: Ability to manage health-related needs will improve Outcome: Progressing

## 2023-05-29 NOTE — Progress Notes (Signed)
 Patient ID: Calvin Drake, male   DOB: 05-06-1960, 63 y.o.   MRN: [id number]     Advanced Heart Failure Rounding Note  Cardiologist: Cody Das, MD  Chief Complaint: Heart Failure  Subjective:    5/5: S/P MVR . Extubated.  5/6: Hgb 7.6. Given 1UPRBC  Hgb post transfusion 7.9. MT removed.   5/7: Diuresed with IV lasix  and switched to lasix  drip. Given 1UPRBCs. Weaned off Vaso & Epi.   Lasix  stopped with AKI, creatinine now trending down 2.76 => 2.84 => 2.34.  I/Os negative without diuretics, weight continues to fall. CVP 7, co-ox 78% on milrinone  0.125.   Feels better, has been walking around unit.   Objective:    Weight Range: 92.4 kg Body mass index is 31.9 kg/m.   Vital Signs:   Temp:  [97.8 F (36.6 C)-98.8 F (37.1 C)] 98 F (36.7 C) (05/11 1137) Pulse Rate:  [106-117] 111 (05/11 0700) Resp:  [13-26] 19 (05/11 0700) BP: (91-125)/(58-80) 115/63 (05/11 0700) SpO2:  [90 %-100 %] 96 % (05/11 0700) Weight:  [92.4 kg] 92.4 kg (05/11 0500) Last BM Date : 05/29/23  Weight change: Filed Weights   05/26/23 0500 05/27/23 0109 05/29/23 0500  Weight: 98.1 kg 97.4 kg 92.4 kg   Intake/Output:  Intake/Output Summary (Last 24 hours) at 05/29/2023 1146 Last data filed at 05/29/2023 0700 Gross per 24 hour  Intake 202.3 ml  Output 1650 ml  Net -1447.7 ml   Physical Exam   CVP 7 General: NAD Neck: No JVD, no thyromegaly or thyroid nodule.  Lungs: Clear to auscultation bilaterally with normal respiratory effort. CV: Nondisplaced PMI.  Heart regular S1/S2, no S3/S4, no murmur.  Trace ankle edema.   Abdomen: Soft, nontender, no hepatosplenomegaly, no distention.  Skin: Intact without lesions or rashes.  Neurologic: Alert and oriented x 3.  Psych: Normal affect. Extremities: No clubbing or cyanosis.  HEENT: Normal.   Telemetry   ST 110 (personally reviewed)  Labs    CBC Recent Labs    05/28/23 0433 05/29/23 0408  WBC 10.7* 11.3*  HGB 8.5* 7.9*  HCT 25.6*  24.9*  MCV 88.3 88.9  PLT 108* 112*   Basic Metabolic Panel Recent Labs    13/24/40 0433 05/29/23 0408  NA 133* 136  K 3.4* 3.8  CL 98 103  CO2 28 23  GLUCOSE 100* 100*  BUN 36* 37*  CREATININE 2.84* 2.34*  CALCIUM  8.0* 7.8*   BNP (last 3 results) Recent Labs    04/18/23 1618  BNP 566.1*   Imaging    No results found.  Medications:    Scheduled Medications:  sodium chloride    Intravenous Once   aspirin  EC  325 mg Oral Daily   Or   aspirin   324 mg Per Tube Daily   bisacodyl   10 mg Oral Daily   Or   bisacodyl   10 mg Rectal Daily   Chlorhexidine  Gluconate Cloth  6 each Topical Daily   cyclobenzaprine   5 mg Oral QHS   docusate sodium   200 mg Oral Daily   insulin  aspart  0-15 Units Subcutaneous TID WC   insulin  aspart  0-5 Units Subcutaneous QHS   insulin  aspart  5 Units Subcutaneous TID WC   melatonin  3 mg Oral QHS   pantoprazole   40 mg Oral Daily   polyethylene glycol  17 g Oral Daily   rosuvastatin   40 mg Oral Daily   sodium chloride  flush  10-40 mL Intracatheter Q12H   sodium chloride   flush  3 mL Intravenous Q12H   Infusions:   PRN Medications: dextrose , lactulose, midazolam , morphine  injection, ondansetron  (ZOFRAN ) IV, oxyCODONE , sodium chloride  flush, sodium chloride  flush, traMADol   Patient Profile   Calvin Drake is a 63 y.o. male with history of severe MR. Admitted for workup/management after TEE and R/LHC. 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  - Echo 4/25: EF 60-65%, grade II DD, septum flattened in systole and diastole c/w RV pressure and volume overload, RVSP 55 mmHg, severe BAE, severe MR Echo  - TEE 4/25: EF 50-55%. RV severely reduced. 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  - Repeat RHC 05/20/23- low filling pressures; RA 1, PCWP 14-16. Cardiac index  remains reduced by TD despite addition of milrinone  0.25mcg/kg/min - Post-op Echo 05/26/23: EF 45-50%, G2DD, RV nl. - Lasix  gtt stopped with AKI despite ongoing volume overload.  However, since that time he has autodiuresed and weight is down significantly.  CVP 7.  He continues to have good UOP, do not think he needs Lasix  at this point.  - Stop milrinone  today, co-ox 78%.    3. Anemia, Expected Blood Loss - 1u RBCs 5/6 and 5/7 - Hgb 7.9>8.5>7.9 - Transfuse hgb < 7.5  4. CAD - nonobstructive CAD by LHC - continue statin.   5. AKI - Creatinine 2.76 => 2.84 => 2.34.  Improving with auto-diuresis.    Length of Stay: 13  CRITICAL CARE Performed by: Peder Bourdon  Total critical care time: 35 minutes  Critical care time was exclusive of separately billable procedures and treating other patients.  Critical care was necessary to treat or prevent imminent or life-threatening deterioration.  Critical care was time spent personally by me on the following activities: development of treatment plan with patient and/or surrogate as well as nursing, discussions with consultants, evaluation of patient's response to treatment, examination of patient, obtaining history from patient or surrogate, ordering and performing treatments and interventions, ordering and review of laboratory studies, ordering and review of radiographic studies, pulse oximetry and re-evaluation of patient's condition.  Peder Bourdon 05/29/2023 11:46 AM

## 2023-05-29 NOTE — Progress Notes (Signed)
      [address]       [place] [address]             [phone]       No complaints  Stable day  Off milrinone   Milon Aloe. Luna Salinas, MD [facility] [phone]

## 2023-05-29 NOTE — Progress Notes (Signed)
 6 Days Post-Op Procedure(s) (LRB): REPAIR, MITRAL VALVE USING SIMUFORM SEMI-RIGID ANNULOPLASTY RING , CLOSURE OF PATENT FORAMEN OVALE (N/A) ECHOCARDIOGRAM, TRANSESOPHAGEAL, INTRAOPERATIVE (N/A) Subjective: Feels well, still hungry after breakfast  Objective: Vital signs in last 24 hours: Temp:  [97.8 F (36.6 C)-98.8 F (37.1 C)] 98.2 F (36.8 C) (05/11 0744) Pulse Rate:  [106-117] 111 (05/11 0700) Cardiac Rhythm: Sinus tachycardia (05/10 2000) Resp:  [13-26] 19 (05/11 0700) BP: (91-125)/(58-80) 115/63 (05/11 0700) SpO2:  [90 %-100 %] 96 % (05/11 0700) Weight:  [92.4 kg] 92.4 kg (05/11 0500)  Hemodynamic parameters for last 24 hours: CVP:  [5 mmHg-43 mmHg] 9 mmHg  Intake/Output from previous day: 05/10 0701 - 05/11 0700 In: 312 [I.V.:78; IV Piggyback:233.9] Out: 1650 [Urine:1650] Intake/Output this shift: No intake/output data recorded.  General appearance: alert, cooperative, and no distress Neurologic: intact Heart: regular rate and rhythm Lungs: clear to auscultation bilaterally Abdomen: normal findings: soft, non-tender Wound: clean and dry  Lab Results: Recent Labs    05/28/23 0433 05/29/23 0408  WBC 10.7* 11.3*  HGB 8.5* 7.9*  HCT 25.6* 24.9*  PLT 108* 112*   BMET:  Recent Labs    05/28/23 0433 05/29/23 0408  NA 133* 136  K 3.4* 3.8  CL 98 103  CO2 28 23  GLUCOSE 100* 100*  BUN 36* 37*  CREATININE 2.84* 2.34*  CALCIUM  8.0* 7.8*    PT/INR: No results for input(s): "LABPROT", "INR" in the last 72 hours. ABG    Component Value Date/Time   PHART 7.375 05/23/2023 1957   HCO3 17.7 (L) 05/23/2023 1957   TCO2 19 (L) 05/23/2023 1957   ACIDBASEDEF 7.0 (H) 05/23/2023 1957   O2SAT 77.8 05/29/2023 0408   CBG (last 3)  Recent Labs    05/28/23 1610 05/28/23 2046 05/29/23 0619  GLUCAP 112* 188* 79    Assessment/Plan: S/P Procedure(s) (LRB): REPAIR, MITRAL VALVE USING SIMUFORM SEMI-RIGID ANNULOPLASTY RING , CLOSURE OF PATENT FORAMEN OVALE  (N/A) ECHOCARDIOGRAM, TRANSESOPHAGEAL, INTRAOPERATIVE (N/A) Looks much better  NEURO- intact CV- in sinus tach but rate improved  CVP 5 and co-ox 78 on milrinone  0.125  Will discuss milrinone  with AHF  ASA, no beta blocker yet- start low dose coreg when off milrinone  RESP- much improved RENAL- creatinine down to 2.34 c/w resolving AKI  Diuresed well 15L negative since OR ENDO- CBG elevated last night, normal this AM  Continue SSI + meal coverage Gi- tolerating diet Continue cardiac rehab   LOS: 13 days    Calvin Drake 05/29/2023

## 2023-05-30 LAB — CBC
HCT: 30.1 % — ABNORMAL LOW (ref 39.0–52.0)
Hemoglobin: 9.6 g/dL — ABNORMAL LOW (ref 13.0–17.0)
MCH: 28.7 pg (ref 26.0–34.0)
MCHC: 31.9 g/dL (ref 30.0–36.0)
MCV: 89.9 fL (ref 80.0–100.0)
Platelets: 134 10*3/uL — ABNORMAL LOW (ref 150–400)
RBC: 3.35 MIL/uL — ABNORMAL LOW (ref 4.22–5.81)
RDW: 14.3 % (ref 11.5–15.5)
WBC: 15.2 10*3/uL — ABNORMAL HIGH (ref 4.0–10.5)
nRBC: 0 % (ref 0.0–0.2)

## 2023-05-30 LAB — BASIC METABOLIC PANEL WITH GFR
Anion gap: 8 (ref 5–15)
BUN: 28 mg/dL — ABNORMAL HIGH (ref 8–23)
CO2: 26 mmol/L (ref 22–32)
Calcium: 8.5 mg/dL — ABNORMAL LOW (ref 8.9–10.3)
Chloride: 102 mmol/L (ref 98–111)
Creatinine, Ser: 2.06 mg/dL — ABNORMAL HIGH (ref 0.61–1.24)
GFR, Estimated: 36 mL/min — ABNORMAL LOW (ref >60)
Glucose, Bld: 92 mg/dL (ref 70–99)
Potassium: 4 mmol/L (ref 3.5–5.1)
Sodium: 136 mmol/L (ref 135–145)

## 2023-05-30 LAB — GLUCOSE, CAPILLARY
Glucose-Capillary: 87 mg/dL (ref 70–99)
Glucose-Capillary: 104 mg/dL — ABNORMAL HIGH (ref 70–99)
Glucose-Capillary: 148 mg/dL — ABNORMAL HIGH (ref 70–99)
Glucose-Capillary: 137 mg/dL — ABNORMAL HIGH (ref 70–99)

## 2023-05-30 LAB — COOXEMETRY PANEL
Carboxyhemoglobin: 2.4 % — ABNORMAL HIGH (ref 0.5–1.5)
Methemoglobin: <0.7 % (ref 0.0–1.5)
O2 Saturation: 76.9 %
Total hemoglobin: 10 g/dL — ABNORMAL LOW (ref 12.0–16.0)

## 2023-05-30 MED ORDER — ~~LOC~~ CARDIAC SURGERY, PATIENT & FAMILY EDUCATION: Freq: Once | Status: AC

## 2023-05-30 MED ORDER — SODIUM CHLORIDE 0.9 % IV SOLN: 250.0000 mL | INTRAVENOUS | Status: AC | PRN

## 2023-05-30 MED ORDER — SODIUM CHLORIDE 0.9% FLUSH: 3.0000 mL | INTRAVENOUS | Status: DC | PRN

## 2023-05-30 MED ORDER — SODIUM CHLORIDE 0.9% FLUSH
3.0000 mL | Freq: Two times a day (BID) | INTRAVENOUS | Status: DC
Start: 2023-05-30 — End: 2023-06-02
  Administered 2023-06-01: 3 mL via INTRAVENOUS

## 2023-05-30 NOTE — Progress Notes (Signed)
 Patient ID: Calvin Drake, male   DOB: 1960/05/15, 63 y.o.   MRN: [id number]     Advanced Heart Failure Rounding Note  Cardiologist: Calvin Das, MD  Chief Complaint: Heart Failure  Subjective:    5/5: S/P MVR . Extubated.  5/6: Hgb 7.6. Given 1UPRBC  Hgb post transfusion 7.9. MT removed.   5/7: Diuresed with IV lasix  and switched to lasix  drip. Given 1UPRBCs. Weaned off Vaso & Epi.   Lasix  stopped with AKI, creatinine now trending down 2.76 => 2.84 => 2.34 -> 2.06.  I/Os negative without diuretics, weight continues to fall. CVP 7  Milrinone  stopped yesterday - co-ox 77%  Feels good. No CP or SOB. Remains in NSR.   Objective:    Weight Range: 91.6 kg Body mass index is 31.63 kg/m.   Vital Signs:   Temp:  [97.7 F (36.5 C)-98.4 F (36.9 C)] 97.7 F (36.5 C) (05/12 0830) Pulse Rate:  [94-106] 101 (05/12 0800) Resp:  [13-23] 23 (05/12 0800) BP: (97-134)/(66-97) 119/73 (05/12 0800) SpO2:  [95 %-98 %] 98 % (05/12 0800) Weight:  [91.6 kg] 91.6 kg (05/12 0434) Last BM Date : 05/28/23  Weight change: Filed Weights   05/27/23 0109 05/29/23 0500 05/30/23 0434  Weight: 97.4 kg 92.4 kg 91.6 kg   Intake/Output:  Intake/Output Summary (Last 24 hours) at 05/30/2023 0936 Last data filed at 05/30/2023 0915 Gross per 24 hour  Intake 45.28 ml  Output 2625 ml  Net -2579.72 ml   Physical Exam   General:  Sitting up in bed. No resp difficulty HEENT: normal Neck: supple. no JVD. Carotids 2+ bilat; no bruits. No lymphadenopathy or thryomegaly appreciated. Cor: Sternal wound ok . Regular rate & rhythm. No rubs, gallops or murmurs. Lungs: clear Abdomen: soft, nontender, nondistended. No hepatosplenomegaly. No bruits or masses. Good bowel sounds. Extremities: no cyanosis, clubbing, rash, edema Neuro: alert & orientedx3, cranial nerves grossly intact. moves all 4 extremities w/o difficulty. Affect pleasant  Telemetry   ST 95-110 (personally reviewed)  Labs    CBC Recent  Labs    05/29/23 0408 05/30/23 0423  WBC 11.3* 15.2*  HGB 7.9* 9.6*  HCT 24.9* 30.1*  MCV 88.9 89.9  PLT 112* 134*   Basic Metabolic Panel Recent Labs    01/19/70 0408 05/30/23 0423  NA 136 136  K 3.8 4.0  CL 103 102  CO2 23 26  GLUCOSE 100* 92  BUN 37* 28*  CREATININE 2.34* 2.06*  CALCIUM  7.8* 8.5*   BNP (last 3 results) Recent Labs    04/18/23 1618  BNP 566.1*   Imaging    No results found.  Medications:    Scheduled Medications:  aspirin  EC  325 mg Oral Daily   Or   aspirin   324 mg Per Tube Daily   bisacodyl   10 mg Oral Daily   Or   bisacodyl   10 mg Rectal Daily   Chlorhexidine  Gluconate Cloth  6 each Topical Daily   cyclobenzaprine   5 mg Oral QHS   docusate sodium   200 mg Oral Daily   insulin  aspart  0-15 Units Subcutaneous TID WC   insulin  aspart  0-5 Units Subcutaneous QHS   melatonin  3 mg Oral QHS   pantoprazole   40 mg Oral Daily   polyethylene glycol  17 g Oral Daily   rosuvastatin   40 mg Oral Daily   sodium chloride  flush  10-40 mL Intracatheter Q12H   sodium chloride  flush  3 mL Intravenous Q12H   Infusions:  sodium chloride       PRN Medications: sodium chloride , calcium  carbonate, lactulose, ondansetron  (ZOFRAN ) IV, oxyCODONE , sodium chloride  flush, sodium chloride  flush  Patient Profile   Calvin Drake is a 63 y.o. male with history of severe MR. Admitted for workup/management after TEE and R/LHC. 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  - Echo 4/25: EF 60-65%, grade II DD, septum flattened in systole and diastole c/w RV pressure and volume overload, RVSP 55 mmHg, severe BAE, severe MR Echo  - TEE 4/25: EF 50-55%. RV severely reduced. 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  - Repeat RHC 05/20/23- low filling pressures; RA 1, PCWP 14-16.  Cardiac index remains reduced by TD despite addition of milrinone  0.25mcg/kg/min - Post-op Echo 05/26/23: EF 45-50%, G2DD, RV nl. - Off milrinone . Co-ox 78% - Weight still up about 15 pounds from pre-op (185 lbs). Auto diuresing. Can restart diuretics as needed - Add back GDMT as renal function and BP allow   3. Anemia, Expected Blood Loss - 1u RBCs 5/6 and 5/7 - Hgb 7.9>8.5>7.9 - Transfuse hgb < 7.5  4. CAD - nonobstructive CAD by LHC - continue statin.   5. AKI likely due to ATN - Creatinine 2.76 => 2.84 => 2.34 -> 2.06.   - Continue to hold diuretics   Ok tot transfer to floor. Gen Cards to resume following.   Length of Stay: 14  Calvin Drake 05/30/2023 9:36 AM

## 2023-05-30 NOTE — Progress Notes (Signed)
 [facility] bath given,vitals checked,CCMD notified ,no complaints of pain,pt oriented to the unit

## 2023-05-30 NOTE — Plan of Care (Signed)
   Problem: Education: Goal: Understanding of CV disease, CV risk reduction, and recovery process will improve Outcome: Progressing   Problem: Activity: Goal: Ability to return to baseline activity level will improve Outcome: Progressing   Problem: Cardiovascular: Goal: Ability to achieve and maintain adequate cardiovascular perfusion will improve Outcome: Progressing

## 2023-05-30 NOTE — Progress Notes (Signed)
 7 Days Post-Op Procedure(s) (LRB): REPAIR, MITRAL VALVE USING SIMUFORM SEMI-RIGID ANNULOPLASTY RING , CLOSURE OF PATENT FORAMEN OVALE (N/A) ECHOCARDIOGRAM, TRANSESOPHAGEAL, INTRAOPERATIVE (N/A) Subjective: No complaints this AM  Objective: Vital signs in last 24 hours: Temp:  [98 F (36.7 C)-98.4 F (36.9 C)] 98 F (36.7 C) (05/12 0434) Pulse Rate:  [94-106] 99 (05/12 0700) Cardiac Rhythm: Sinus tachycardia (05/12 0434) Resp:  [13-23] 13 (05/12 0700) BP: (97-134)/(66-97) 121/68 (05/12 0700) SpO2:  [95 %-97 %] 97 % (05/12 0700) Weight:  [91.6 kg] 91.6 kg (05/12 0434)  Hemodynamic parameters for last 24 hours: CVP:  [5 mmHg-14 mmHg] 10 mmHg  Intake/Output from previous day: 05/11 0701 - 05/12 0700 In: 35.3 [I.V.:35.3] Out: 2625 [Urine:2625] Intake/Output this shift: No intake/output data recorded.  General appearance: alert, cooperative, and no distress Neurologic: intact Heart: regular rate and rhythm Lungs: diminished breath sounds bibasilar Wound: clean and dry  Lab Results: Recent Labs    05/29/23 0408 05/30/23 0423  WBC 11.3* 15.2*  HGB 7.9* 9.6*  HCT 24.9* 30.1*  PLT 112* 134*   BMET:  Recent Labs    05/29/23 0408 05/30/23 0423  NA 136 136  K 3.8 4.0  CL 103 102  CO2 23 26  GLUCOSE 100* 92  BUN 37* 28*  CREATININE 2.34* 2.06*  CALCIUM  7.8* 8.5*    PT/INR: No results for input(s): "LABPROT", "INR" in the last 72 hours. ABG    Component Value Date/Time   PHART 7.375 05/23/2023 1957   HCO3 17.7 (L) 05/23/2023 1957   TCO2 19 (L) 05/23/2023 1957   ACIDBASEDEF 7.0 (H) 05/23/2023 1957   O2SAT 76.9 05/30/2023 0423   CBG (last 3)  Recent Labs    05/29/23 1601 05/29/23 2135 05/30/23 0700  GLUCAP 117* 135* 87    Assessment/Plan: S/P Procedure(s) (LRB): REPAIR, MITRAL VALVE USING SIMUFORM SEMI-RIGID ANNULOPLASTY RING , CLOSURE OF PATENT FORAMEN OVALE (N/A) ECHOCARDIOGRAM, TRANSESOPHAGEAL, INTRAOPERATIVE (N/A) POD # 7 NEURO-  intact CV- in SR, BP well controlled  Co-ox 77 off milrinone   ASA, statin, may add low dose Coreg in a day or 2 RESP- continue IS RENAL- creatinine continues to decrease, lytes OK  Down 17 pounds from max, but still above preop  Will likely need pO Lasix  at some point but continues to autodiurese ENDO- CBG well controlled  Dc meal coverage GI- tolerating diet SCD + enoxaparin Continue cardiac rehab  LOS: 14 days    Zelphia Higashi 05/30/2023

## 2023-05-30 NOTE — Progress Notes (Signed)
 Physical Therapy Treatment Patient Details Name: Calvin Drake MRN: [id number] DOB: 1960/12/15 Today's Date: 05/30/2023   History of Present Illness 63 y.o. male [facility]23 with exertional SOB and fatigue. Pt with severe MR and moderate CAD. 4/28 R/L heart cath and angiography. 5/2 R heart cath. 5/5 MVR and closure of patent foramen ovale. 5/6 given 1UPRBC. PMHx: CHF, CAD    PT Comments  Pt tolerates treatment well, ambulating with support of a RW rather than EVA walker. Pt demonstrates continued improvement in endurance and functional mobility quality. PT encourages continued frequent ambulation with staff assistance. Pt is progressing well.    If plan is discharge home, recommend the following: A little help with walking and/or transfers;A little help with bathing/dressing/bathroom;Assistance with cooking/housework;Assist for transportation;Help with stairs or ramp for entrance   Can travel by private vehicle        Equipment Recommendations  Rolling walker (2 wheels)    Recommendations for Other Services       Precautions / Restrictions Precautions Precautions: Fall;Sternal Precaution Booklet Issued: Yes (comment) Recall of Precautions/Restrictions: Impaired Precaution/Restrictions Comments: verbal cues for sternal precautions during transfer and bed mobility Restrictions Weight Bearing Restrictions Per Provider Order: No Other Position/Activity Restrictions: sternal precautions     Mobility  Bed Mobility Overal bed mobility: Needs Assistance Bed Mobility: Rolling, Sidelying to Sit Rolling: Supervision Sidelying to sit: Contact guard assist, HOB elevated     Sit to sidelying: Contact guard assist, HOB elevated General bed mobility comments: verbal cues for technique    Transfers Overall transfer level: Needs assistance Equipment used: Rolling walker (2 wheels) Transfers: Sit to/from Stand Sit to Stand: Contact guard assist           General  transfer comment: verbal cues for technique    Ambulation/Gait Ambulation/Gait assistance: Supervision Gait Distance (Feet): 300 Feet Assistive device: Rolling walker (2 wheels) Gait Pattern/deviations: Step-through pattern Gait velocity: reduced Gait velocity interpretation: 1.31 - 2.62 ft/sec, indicative of limited community ambulator   General Gait Details: steady step-through gait, widened BOS   Stairs             Wheelchair Mobility     Tilt Bed    Modified Rankin (Stroke Patients Only)       Balance Overall balance assessment: Needs assistance Sitting-balance support: No upper extremity supported, Feet supported Sitting balance-Leahy Scale: Good     Standing balance support: Single extremity supported, Reliant on assistive device for balance Standing balance-Leahy Scale: Poor                              Communication Communication Communication: No apparent difficulties  Cognition Arousal: Alert Behavior During Therapy: WFL for tasks assessed/performed   PT - Cognitive impairments: No apparent impairments                         Following commands: Intact      Cueing Cueing Techniques: Verbal cues  Exercises      General Comments General comments (skin integrity, edema, etc.): VSS on RA, mild tachycardia into 110s with activity      Pertinent Vitals/Pain Pain Assessment Pain Assessment: Faces Faces Pain Scale: Hurts a little bit Pain Location: sternal incision Pain Descriptors / Indicators: Grimacing Pain Intervention(s): Monitored during session    Home Living  Prior Function            PT Goals (current goals can now be found in the care plan section) Acute Rehab PT Goals Patient Stated Goal: to get better Progress towards PT goals: Progressing toward goals    Frequency    Min 2X/week      PT Plan      Co-evaluation              AM-PAC PT "6 Clicks"  Mobility   Outcome Measure  Help needed turning from your back to your side while in a flat bed without using bedrails?: A Little Help needed moving from lying on your back to sitting on the side of a flat bed without using bedrails?: A Little Help needed moving to and from a bed to a chair (including a wheelchair)?: A Little Help needed standing up from a chair using your arms (e.g., wheelchair or bedside chair)?: A Little Help needed to walk in hospital room?: A Little Help needed climbing 3-5 steps with a railing? : A Little 6 Click Score: 18    End of Session   Activity Tolerance: Patient tolerated treatment well Patient left: in bed;with call bell/phone within reach Nurse Communication: Mobility status PT Visit Diagnosis: Other abnormalities of gait and mobility (R26.89)     Time: 1610-9604 PT Time Calculation (min) (ACUTE ONLY): 23 min  Charges:    $Gait Training: 8-22 mins $Therapeutic Activity: 8-22 mins PT General Charges $$ ACUTE PT VISIT: 1 Visit                     Rexie Catena, PT, DPT Acute Rehabilitation Office [phone]    Rexie Catena 05/30/2023, 5:46 PM

## 2023-05-31 ENCOUNTER — Other Ambulatory Visit (HOSPITAL_COMMUNITY): Payer: Self-pay

## 2023-05-31 ENCOUNTER — Inpatient Hospital Stay (HOSPITAL_COMMUNITY): Payer: Self-pay

## 2023-05-31 LAB — BASIC METABOLIC PANEL WITH GFR
Anion gap: 8 (ref 5–15)
BUN: 26 mg/dL — ABNORMAL HIGH (ref 8–23)
CO2: 25 mmol/L (ref 22–32)
Calcium: 8.7 mg/dL — ABNORMAL LOW (ref 8.9–10.3)
Chloride: 102 mmol/L (ref 98–111)
Creatinine, Ser: 1.64 mg/dL — ABNORMAL HIGH (ref 0.61–1.24)
GFR, Estimated: 47 mL/min — ABNORMAL LOW (ref >60)
Glucose, Bld: 101 mg/dL — ABNORMAL HIGH (ref 70–99)
Potassium: 4.1 mmol/L (ref 3.5–5.1)
Sodium: 135 mmol/L (ref 135–145)

## 2023-05-31 LAB — CBC
HCT: 29 % — ABNORMAL LOW (ref 39.0–52.0)
Hemoglobin: 9.6 g/dL — ABNORMAL LOW (ref 13.0–17.0)
MCH: 28.6 pg (ref 26.0–34.0)
MCHC: 33.1 g/dL (ref 30.0–36.0)
MCV: 86.3 fL (ref 80.0–100.0)
Platelets: 149 10*3/uL — ABNORMAL LOW (ref 150–400)
RBC: 3.36 MIL/uL — ABNORMAL LOW (ref 4.22–5.81)
RDW: 14.4 % (ref 11.5–15.5)
WBC: 15.7 10*3/uL — ABNORMAL HIGH (ref 4.0–10.5)
nRBC: 0 % (ref 0.0–0.2)

## 2023-05-31 LAB — GLUCOSE, CAPILLARY
Glucose-Capillary: 97 mg/dL (ref 70–99)
Glucose-Capillary: 167 mg/dL — ABNORMAL HIGH (ref 70–99)
Glucose-Capillary: 132 mg/dL — ABNORMAL HIGH (ref 70–99)
Glucose-Capillary: 110 mg/dL — ABNORMAL HIGH (ref 70–99)

## 2023-05-31 MED ORDER — FUROSEMIDE 20 MG PO TABS
20.0000 mg | ORAL_TABLET | Freq: Every day | ORAL | Status: DC
  Administered 2023-05-31 – 2023-06-01 (×2): 20 mg via ORAL
  Filled 2023-05-31 (×2): qty 1

## 2023-05-31 MED ORDER — POTASSIUM CHLORIDE ER 10 MEQ PO TBCR
20.0000 meq | EXTENDED_RELEASE_TABLET | Freq: Every day | ORAL | Status: DC
Start: 2023-05-31 — End: 2023-05-31
  Administered 2023-05-31: 20 meq via ORAL
  Filled 2023-05-31: qty 1
  Filled 2023-05-31: qty 2

## 2023-05-31 MED ORDER — POTASSIUM CHLORIDE CRYS ER 10 MEQ PO TBCR: 10.0000 meq | EXTENDED_RELEASE_TABLET | Freq: Every day | ORAL | Status: DC

## 2023-05-31 MED ORDER — POTASSIUM CHLORIDE CRYS ER 10 MEQ PO TBCR
10.0000 meq | EXTENDED_RELEASE_TABLET | Freq: Two times a day (BID) | ORAL | Status: DC
  Filled 2023-05-31 (×3): qty 1

## 2023-05-31 MED ORDER — CARVEDILOL 3.125 MG PO TABS
3.1250 mg | ORAL_TABLET | Freq: Two times a day (BID) | ORAL | Status: DC
  Administered 2023-05-31 – 2023-06-02 (×4): 3.125 mg via ORAL
  Filled 2023-05-31 (×4): qty 1

## 2023-05-31 MED ORDER — POTASSIUM CHLORIDE CRYS ER 10 MEQ PO TBCR: 10.0000 meq | EXTENDED_RELEASE_TABLET | Freq: Once | ORAL | Status: DC

## 2023-05-31 NOTE — TOC Transition Note (Addendum)
 Transition of Care [facility]) - Discharge Note   Patient Details  Name: Calvin Drake MRN: [id number] Date of Birth: November 29, 1960  [facility]) CM/SW Contact:  Benjiman Bras, RN Phone Number: [phone] 05/31/2023, 1:49 PM   Clinical Narrative:     TOC CM ordered RW for patient. Cancelled RW order, wife states they can pick up a RW. She will schedule hospital follow up appt with PCP.   Pt was provided a new RW from TOC CM. MATCH for his medications. Provided pt with Living Better with HF booklet. Pt has scale at home.     Final next level of care: Home/Self Care Barriers to Discharge: No Barriers Identified   Patient Goals and CMS Choice Patient states their goals for this hospitalization and ongoing recovery are:: wants to remain independent          Discharge Placement                       Discharge Plan and Services Additional resources added to the After Visit Summary for     Discharge Planning Services: CM Consult            DME Arranged: Otho Blitz rolling DME Agency: [employer] Date DME Agency Contacted: 05/31/23 Time DME Agency Contacted: 1345 Representative spoke with at DME Agency: Mitch            Social Drivers of Health (SDOH) Interventions SDOH Screenings   Food Insecurity: No Food Insecurity (05/17/2023)  Housing: Low Risk  (05/17/2023)  Transportation Needs: No Transportation Needs (05/17/2023)  Utilities: Not At Risk (05/17/2023)  Tobacco Use: Low Risk  (05/23/2023)     Readmission Risk Interventions     No data to display

## 2023-05-31 NOTE — Progress Notes (Addendum)
 [address]       [place] [address]             [phone]      8 Days Post-Op Procedure(s) (LRB): REPAIR, MITRAL VALVE USING SIMUFORM SEMI-RIGID ANNULOPLASTY RING , CLOSURE OF PATENT FORAMEN OVALE (N/A) ECHOCARDIOGRAM, TRANSESOPHAGEAL, INTRAOPERATIVE (N/A) Subjective: Patient has no complaints this AM, states he needs to get home.  Objective: Vital signs in last 24 hours: Temp:  [97.7 F (36.5 C)-98.7 F (37.1 C)] 98.4 F (36.9 C) (05/13 0321) Pulse Rate:  [98-108] 99 (05/13 0321) Cardiac Rhythm: Sinus tachycardia (05/12 1900) Resp:  [18-23] 19 (05/13 0321) BP: (95-130)/(59-88) 125/87 (05/13 0321) SpO2:  [95 %-99 %] 98 % (05/13 0321) Weight:  [91.2 kg] 91.2 kg (05/13 0321)  Hemodynamic parameters for last 24 hours: CVP:  [9 mmHg] 9 mmHg  Intake/Output from previous day: 05/12 0701 - 05/13 0700 In: 450 [P.O.:440; I.V.:10] Out: 1875 [Urine:1875] Intake/Output this shift: No intake/output data recorded.  General appearance: alert, cooperative, and no distress Neurologic: intact Heart: regular rate and rhythm, S1, S2 normal, no murmur, click, rub or gallop Lungs: clear to auscultation bilaterally Abdomen: soft, non-tender; bowel sounds normal; no masses,  no organomegaly Extremities: edema 2+ BLE, compression stockings in place Wound: Clean and dry without sign of infection  Lab Results: Recent Labs    05/30/23 0423 05/31/23 0425  WBC 15.2* 15.7*  HGB 9.6* 9.6*  HCT 30.1* 29.0*  PLT 134* 149*   BMET:  Recent Labs    05/30/23 0423 05/31/23 0425  NA 136 135  K 4.0 4.1  CL 102 102  CO2 26 25  GLUCOSE 92 101*  BUN 28* 26*  CREATININE 2.06* 1.64*  CALCIUM  8.5* 8.7*    PT/INR: No results for input(s): "LABPROT", "INR" in the last 72 hours. ABG    Component Value Date/Time   PHART 7.375 05/23/2023 1957   HCO3 17.7 (L) 05/23/2023 1957   TCO2 19 (L) 05/23/2023 1957   ACIDBASEDEF 7.0 (H) 05/23/2023 1957   O2SAT 76.9  05/30/2023 0423   CBG (last 3)  Recent Labs    05/30/23 1633 05/30/23 2113 05/31/23 0613  GLUCAP 148* 137* 97    Assessment/Plan: S/P Procedure(s) (LRB): REPAIR, MITRAL VALVE USING SIMUFORM SEMI-RIGID ANNULOPLASTY RING , CLOSURE OF PATENT FORAMEN OVALE (N/A) ECHOCARDIOGRAM, TRANSESOPHAGEAL, INTRAOPERATIVE (N/A)  CV: HFpEF, AHF signed off, plans for gen cards to follow and add GDMT as able. Appreciate their assistance. BP controlled, SBP 112-130. NSR, HR 90s this AM. May be able to add low dose Lopressor ?  Pulm: Saturating well on RA. CXR with bibasilar atelectasis. Encourage IS and ambulation.   GI: +BM, tolerating a diet.   Endo: Preop A1C 5.9, likely prediabetic. CBGs controlled on SSI. Patient will need to follow up closely with PCP.   Renal: AKI likely due to ATN. Cr improved 1.64 down from 2.06 yesterday. UO 1875cc/24hrs. Holding diuresis, continues to auto diurese. Down about 1lb from yesterday, still +15-16lbs from preop. May need to restart gentle diuresis.   ID: Likely reactive leukocytosis stable. Afebrile. Clinically monitor.   Expected postop ABLA: H/H 9.6/29, stable. Not clinically significant at this time. Likely reactive thrombocytopenia improved.   DVT Prophylaxis: Lovenox  Dispo: Dispo planning, hopefully can add GDMT and diurese well to be able to d/c in the next few days.    LOS: 15 days    Randa Burton, PA-C 05/31/2023  Patient seen and examined, agree with above Continues  to progress rapidly Will start low dose Coreg Start PO Lasix  today  Adison Reifsteck C. Luna Salinas, MD [facility] [phone]

## 2023-05-31 NOTE — Progress Notes (Signed)
 Physical Therapy Treatment Patient Details Name: Calvin Drake MRN: [id number] DOB: 1960-04-01 Today's Date: 05/31/2023   History of Present Illness 63 y.o. male presents to [facility] 05/16/23 with exertional SOB and fatigue. Pt with severe MR and moderate CAD. 4/28 R/L heart cath and angiography. 5/2 R heart cath. 5/5 MVR and closure of patent foramen ovale. 5/6 given 1UPRBC. PMHx: CHF, CAD    PT Comments  Pt up in chair on arrival and agreeable to session with continued progress towards acute goals. Pt eager to progress ambulation without UE support. Pt demonstrating gait without AD with grossly CGA for safety as pt with increased postural sway and R/L drift in hall, however no overt LOB noted. Pt with good adherence to all precautions throughout session however continues to require prompts to recall verbally. RW delivered to room during session and adjusted to appropriate height,and pt educated on continued walker use to maximize functional independence, safety, and decrease risk for falls. Pt up in chair at end of session with all needs met. Pt continues to benefit from skilled PT services to progress toward functional mobility goals.       If plan is discharge home, recommend the following: A little help with walking and/or transfers;A little help with bathing/dressing/bathroom;Assistance with cooking/housework;Assist for transportation;Help with stairs or ramp for entrance   Can travel by private vehicle        Equipment Recommendations  Rolling walker (2 wheels)    Recommendations for Other Services       Precautions / Restrictions Precautions Precautions: Fall;Sternal Precaution Booklet Issued: Yes (comment) Recall of Precautions/Restrictions: Intact Restrictions Weight Bearing Restrictions Per Provider Order: No Other Position/Activity Restrictions: sternal precautions     Mobility  Bed Mobility Overal bed mobility: Needs Assistance             General bed mobility  comments: up in chair    Transfers Overall transfer level: Needs assistance Equipment used: None Transfers: Sit to/from Stand Sit to Stand: Supervision           General transfer comment: good hand placement with hands crossed across chest    Ambulation/Gait Ambulation/Gait assistance: Contact guard assist Gait Distance (Feet): 470 Feet Assistive device: None Gait Pattern/deviations: Step-through pattern Gait velocity: reduced     General Gait Details: mildy unsteady without UE support with postural sway and drift R/L, pt declining RW use stating he is close to baseline   Optometrist     Tilt Bed    Modified Rankin (Stroke Patients Only)       Balance Overall balance assessment: Needs assistance Sitting-balance support: No upper extremity supported, Feet supported Sitting balance-Leahy Scale: Good     Standing balance support: Single extremity supported, Reliant on assistive device for balance Standing balance-Leahy Scale: Poor Standing balance comment: able to stand statically with no AD                            Communication Communication Communication: No apparent difficulties  Cognition Arousal: Alert Behavior During Therapy: Restless   PT - Cognitive impairments: No apparent impairments                         Following commands: Intact      Cueing Cueing Techniques: Verbal cues  Exercises      General Comments General comments (skin integrity, edema, etc.):  VSS on RA      Pertinent Vitals/Pain Pain Assessment Pain Assessment: No/denies pain Pain Intervention(s): Monitored during session    Home Living                          Prior Function            PT Goals (current goals can now be found in the care plan section) Acute Rehab PT Goals Patient Stated Goal: to get some sleep PT Goal Formulation: With patient/family Time For Goal Achievement: 06/09/23 Progress  towards PT goals: Progressing toward goals    Frequency    Min 2X/week      PT Plan      Co-evaluation              AM-PAC PT "6 Clicks" Mobility   Outcome Measure  Help needed turning from your back to your side while in a flat bed without using bedrails?: A Little Help needed moving from lying on your back to sitting on the side of a flat bed without using bedrails?: A Little Help needed moving to and from a bed to a chair (including a wheelchair)?: A Little Help needed standing up from a chair using your arms (e.g., wheelchair or bedside chair)?: A Little Help needed to walk in hospital room?: A Little Help needed climbing 3-5 steps with a railing? : A Little 6 Click Score: 18    End of Session   Activity Tolerance: Patient tolerated treatment well Patient left: with call bell/phone within reach;in chair Nurse Communication: Mobility status PT Visit Diagnosis: Other abnormalities of gait and mobility (R26.89)     Time: 1610-9604 PT Time Calculation (min) (ACUTE ONLY): 23 min  Charges:    $Gait Training: 8-22 mins $Therapeutic Activity: 8-22 mins PT General Charges $$ ACUTE PT VISIT: 1 Visit                     [name] Acute Rehabilitation Services Office: [phone]   [name] 05/31/2023, 5:31 PM

## 2023-05-31 NOTE — Plan of Care (Signed)
  Problem: Activity: Goal: Ability to return to baseline activity level will improve Outcome: Progressing   Problem: Cardiovascular: Goal: Ability to achieve and maintain adequate cardiovascular perfusion will improve Outcome: Progressing Goal: Vascular access site(s) Level 0-1 will be maintained Outcome: Progressing   

## 2023-05-31 NOTE — Progress Notes (Addendum)
 Patient Name: Calvin Drake Date of Encounter: 05/31/2023 [facility] Cardiologist: Cody Das, MD   Interval Summary  .    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 Torrential MR with eccentric anteriorly directed jet. Mildly reduced LV function, EF 50%. RV is dilated and severely depressed. R/L heart cath 05/16/23 showed 30% pLAD, 60% mLAD, 30% pRCA. LVEDP 28 mmHg. RA 25, RV 78/17, PA 73/35, mPAP 51, PCW 38, AO 93%, PA 50%, CO 3l/min, CI 1.47L/min/m2.  He was admitted after heart cath for acute on chronic HF/RV failure, required milrinone  assisted IV diuresis. Repeat RHC 05/20/23 showed normal filling pressures and PA mean, RA 1, RV 32/1-3, PA 31/18, PCWP 14, Fick CO/CI   4.9 L/min, 2.4 L/min/m2, Low normal cardiac index by Fick; severely reduced by TD. He was optimized and underwent MVR using simuform semi-rigid annuloplasty 32 mm  and closure of PFO by Dr Luna Salinas 05/23/23. He was extubated 05/23/23, required 1u PRBC transfusion for acute blood loss anemia 05/24/23, required additional IV diuresis on 05/25/23 with lasix  gtt, course further complicated by AKI with Cr peaked at 2.84, lasix  stopped and Cr has improved to 2.06 today. He was weaned off vasopressor, Milrinone  stopped yesterday with co-ox 77%. He is now transferred out of ICU to floor, general cardiology will follow going forward.    Patient is ambulating in the hallway with a walker, gait steady. He has been feeling improved and tolerating ambulation for a few days. He wants to go home. He reports adequate pain control. He has been tolerate PO intake. He states he has some water weight on him that needs to come off.    Vital Signs .    Vitals:   05/30/23 1952 05/30/23 2254 05/31/23 0321 05/31/23 0829  BP: 130/88 112/77 125/87 103/67  Pulse:  (!) 108 (!) 104 99 99  Resp: 20 20 19 20   Temp: 98.4 F (36.9 C) 98.7 F (37.1 C) 98.4 F (36.9 C) 98.3 F (36.8 C)  TempSrc: Oral Oral Oral Oral  SpO2: 99% 99% 98% 99%  Weight:   91.2 kg   Height:        Intake/Output Summary (Last 24 hours) at 05/31/2023 0916 Last data filed at 05/31/2023 0333 Gross per 24 hour  Intake 440 ml  Output 1875 ml  Net -1435 ml      05/31/2023    3:21 AM 05/30/2023    4:34 AM 05/29/2023    5:00 AM  Last 3 Weights  Weight (lbs) 201 lb 201 lb 15.1 oz 203 lb 11.3 oz  Weight (kg) 91.173 kg 91.6 kg 92.4 kg      Telemetry/ECG    Sinus rhythm/tachycardia 100s  - Personally Reviewed  Physical Exam .   GEN: No acute distress.   Neck: No JVD Cardiac: RRR, no murmurs, rubs, or gallops.  Respiratory: Clear to auscultation bilaterally. On room air  GI: Soft, nontender, non-distended  MS: No leg edema, compression stocking in place bilaterally   Assessment & Plan .     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 RV severely reduced. Severe MR with  multiple ruptured chordae and severe flail of P3 scallop with involvement of P2 scallop.  - R/L heart cath 05/16/23 showed 30% pLAD, 60% mLAD, 30% pRCA. LVEDP 28 mmHg. RA 25, RV 78/17, PA 73/35, mPAP 51, PCW 38, AO 93%, PA 50%, CO 3l/min, CI 1.47L/min/m2.   - Repeat RHC 05/20/23 (post milrinone  assisted diuresis) - low filling pressures; RA 1, PCWP 14-16. Cardiac index remains reduced by TD despite addition of milrinone  0.25mcg/kg/min - Post MVR Echo 05/26/23: EF 45-50%, G2DD, RV normal  - Off milrinone  since 05/29/23. Co-ox 76.9% 05/30/23 - Lasix  has been held due to AKI, Cr peaked at 2.84 on 05/28/23, downtrended to 1.64 today, baseline 0.9-1, resumed PO lasix  20mg  daily today, can continue at this dose for discharge if diuresis well; weight  is up 16 pounds from pre-op weight 185, Net -17L, continue track  - GDMT: has been limited by AKI as well as low BP, coreg 3.125mg  BID started today, if BP tolerating and AKI resolves, plan to add ARNI and MRA and SGLT2i   Severe mitral regurgitation  - s/p  MVR using simuform semi-rigid annuloplasty 32 mm and closure of PFO by Dr Luna Salinas 05/23/23 - Echo 05/26/23 with LVEF 45-50%, no RWMA, grade II DD, mild LVH, normal RV, mild LAE, 32 mm prosthetic annuloplasty ring present in the mitral position, no MR, mild MS with mean gradient 5 mmHg  - post op care per CT surgery team   Non-obstructive CAD - noted on LHC, on ASA 325mg  daily per CT surgery,  continue crestor  40mg  daily    AKI Anemia  Leukocytosis  - per primary team  For questions or updates, please contact  [facility] Please consult [email or web address] for contact info under     Signed, Xika Zhao, NP   Patient seen and examined, note reviewed with the signed Advanced Practice Provider. I personally reviewed laboratory data, imaging studies and relevant notes. I independently examined the patient and formulated the important aspects of the plan. I have personally discussed the plan with the patient and/or family. Comments or changes to the note/plan are indicated below.  Patient seen and examined his bedside.   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 No off milrinone .  Clinically euvolemic - no need for diuretics at this time will use as needed . Clinically improving as expected post MVR. On Coreg 3.125 mg Bid now, once can tolerate will add other GDMT for optimization.  No angina symptoms at this time Will cont to follow with patient   Evita Merida DO, MS [facility] Attending Cardiologist [facility]  [address] [place], [place] [address] [phone] Website: [email or web address]

## 2023-05-31 NOTE — Progress Notes (Signed)
 Occupational Therapy Treatment Patient Details Name: Calvin Drake MRN: [id number] DOB: Dec 17, 1960 Today's Date: 05/31/2023   History of present illness 63 y.o. male presents to [facility] 05/16/23 with exertional SOB and fatigue. Pt with severe MR and moderate CAD. 4/28 R/L heart cath and angiography. 5/2 R heart cath. 5/5 MVR and closure of patent foramen ovale. 5/6 given 1UPRBC. PMHx: CHF, CAD   OT comments  Pt in chair upon arrival and reports fatigue and not having much rest, agreeable to walk to bathroom for toilet and shower transfers. Pt required verbal cues for technique and for sternal precautions to use heart pillow during sit-stand from chair as pt tossing pillow onto bed and intially declining to use RW. Pt unable to recall "move in the tube", handout on tray table and visitor reports that she is aware of precautions. Pt returned to chair at end of session. OT will continue to follow acutely to maximize level of function and safety      If plan is discharge home, recommend the following:  A little help with walking and/or transfers;A lot of help with bathing/dressing/bathroom;Assistance with cooking/housework;Assist for transportation;Help with stairs or ramp for entrance   Equipment Recommendations  None recommended by OT    Recommendations for Other Services      Precautions / Restrictions Precautions Precautions: Fall;Sternal Precaution Booklet Issued: Yes (comment) Recall of Precautions/Restrictions: Impaired Precaution/Restrictions Comments: verbal cues for sternal precautions to use heart pillow during sit-stand from chair as pt tossing pillow onto bed and intially declining to use RW. Pt unable to recall "move in the tube", handout on tray table and visitor reports that she is aware of precautions Restrictions Weight Bearing Restrictions Per Provider Order: No RUE Weight Bearing Per Provider Order: Non weight bearing LUE Weight Bearing Per Provider Order: Non weight  bearing Other Position/Activity Restrictions: sternal precautions       Mobility Bed Mobility               General bed mobility comments: pt in chair    Transfers Overall transfer level: Needs assistance Equipment used: Rolling walker (2 wheels) Transfers: Sit to/from Stand Sit to Stand: Contact guard assist, Supervision     Step pivot transfers: Supervision     General transfer comment: verbal cues for technique and for sternal precautions to use heart pillow during sit-stand from chair as pt tossing pillow onto bed and intially declining to use RW. Pt unable to recall "move in the tube", handout on tray table and visitor reports that she is aware of precautions     Balance Overall balance assessment: Needs assistance Sitting-balance support: No upper extremity supported, Feet supported Sitting balance-Leahy Scale: Good     Standing balance support: Single extremity supported, Reliant on assistive device for balance Standing balance-Leahy Scale: Poor                             ADL either performed or assessed with clinical judgement   ADL Overall ADL's : Needs assistance/impaired                         Toilet Transfer: Supervision/safety;Ambulation;Rolling walker (2 wheels);Regular Toilet;Cueing for safety   Toileting- Clothing Manipulation and Hygiene: Supervision/safety;Sit to/from stand   Tub/ Shower Transfer: Supervision/safety;Ambulation;Rolling walker (2 wheels);Cueing for safety   Functional mobility during ADLs: Supervision/safety;Cueing for safety      Extremity/Trunk Assessment Upper Extremity Assessment Upper Extremity Assessment: Generalized weakness;Right  hand dominant   Lower Extremity Assessment Lower Extremity Assessment: Defer to PT evaluation   Cervical / Trunk Assessment Cervical / Trunk Assessment: Other exceptions Cervical / Trunk Exceptions: sternal incision    Vision Patient Visual Report: No change from  baseline     Perception     Praxis     Communication Communication Communication: No apparent difficulties   Cognition Arousal: Alert Behavior During Therapy: Agitated, Flat affect                                 Following commands: Intact        Cueing   Cueing Techniques: Verbal cues  Exercises      Shoulder Instructions       General Comments      Pertinent Vitals/ Pain       Pain Assessment Pain Assessment: Faces Faces Pain Scale: Hurts a little bit Pain Location: sternal incision Pain Descriptors / Indicators: Grimacing Pain Intervention(s): Monitored during session, Repositioned  Home Living                                          Prior Functioning/Environment              Frequency  Min 2X/week        Progress Toward Goals  OT Goals(current goals can now be found in the care plan section)  Progress towards OT goals: Progressing toward goals     Plan      Co-evaluation                 AM-PAC OT "6 Clicks" Daily Activity     Outcome Measure   Help from another person eating meals?: None Help from another person taking care of personal grooming?: A Little Help from another person toileting, which includes using toliet, bedpan, or urinal?: A Little Help from another person bathing (including washing, rinsing, drying)?: A Lot Help from another person to put on and taking off regular upper body clothing?: A Little Help from another person to put on and taking off regular lower body clothing?: A Lot 6 Click Score: 17    End of Session Equipment Utilized During Treatment: Rolling walker (2 wheels);Other (comment) (heart pillow)  OT Visit Diagnosis: Unsteadiness on feet (R26.81);Other abnormalities of gait and mobility (R26.89)   Activity Tolerance Patient tolerated treatment well   Patient Left with call bell/phone within reach;with family/visitor present;in chair   Nurse Communication Mobility  status        Time: 1610-9604 OT Time Calculation (min): 14 min  Charges: OT General Charges $OT Visit: 1 Visit OT Treatments $Therapeutic Activity: 8-22 mins    Alfred Ann 05/31/2023, 12:33 PM

## 2023-05-31 NOTE — TOC CM/SW Note (Signed)
  [facility] [employer] [employer] Pharmacies please call: [phone] for claim processing assistance.  Rx BIN: [id number] Rx Group: [id number] Rx PCN: [id number] Relationship Code: 1 Person Code: 01  Patient ID (MRN): [name] [id number]    Patient Name: Calvin Drake       Patient DOB: January 14, 1961      Discharge Date: 05/31/2023      Expiration Date:06/07/2023            (must be filled within 7 days of

## 2023-05-31 NOTE — Progress Notes (Addendum)
 Seen to assist with ambulation in the hallway, pt just received breakfast and would like to eat before walking. Will return later.    Returned at 0936  CARDIAC REHAB PHASE I   PRE:  Rate/Rhythm: 105 ST  BP:  Sitting: 108/69      SpO2: 99 RA  MODE:  Ambulation: 470 ft    POST:  Rate/Rhythm: 116 ST  BP:  Sitting: 122/69      SpO2: 99 RA  Pt able to stand independently. Pt ambulated with supervision assistance and RW in the hallway. Pt walked very well with RW, consider d/c RW when ambulating in the hallway. After returning to room, I encouraged pt to sit in his chair. Pt agreed.   Calvin Li  MS, ACSM-CEP 10:01 AM 05/31/2023    Service time is from 0936 to 1001.

## 2023-06-01 LAB — CBC
HCT: 30.3 % — ABNORMAL LOW (ref 39.0–52.0)
Hemoglobin: 9.9 g/dL — ABNORMAL LOW (ref 13.0–17.0)
MCH: 28.5 pg (ref 26.0–34.0)
MCHC: 32.7 g/dL (ref 30.0–36.0)
MCV: 87.3 fL (ref 80.0–100.0)
Platelets: 170 10*3/uL (ref 150–400)
RBC: 3.47 MIL/uL — ABNORMAL LOW (ref 4.22–5.81)
RDW: 14.6 % (ref 11.5–15.5)
WBC: 16.5 10*3/uL — ABNORMAL HIGH (ref 4.0–10.5)
nRBC: 0 % (ref 0.0–0.2)

## 2023-06-01 LAB — GLUCOSE, CAPILLARY
Glucose-Capillary: 96 mg/dL (ref 70–99)
Glucose-Capillary: 106 mg/dL — ABNORMAL HIGH (ref 70–99)

## 2023-06-01 LAB — URINALYSIS, ROUTINE W REFLEX MICROSCOPIC
Bilirubin Urine: NEGATIVE
Glucose, UA: NEGATIVE mg/dL
Hgb urine dipstick: NEGATIVE
Ketones, ur: NEGATIVE mg/dL
Leukocytes,Ua: NEGATIVE
Nitrite: NEGATIVE
Protein, ur: 30 mg/dL — AB
Specific Gravity, Urine: 1.012 (ref 1.005–1.030)
pH: 6 (ref 5.0–8.0)

## 2023-06-01 LAB — BASIC METABOLIC PANEL WITH GFR
Anion gap: 7 (ref 5–15)
BUN: 22 mg/dL (ref 8–23)
CO2: 26 mmol/L (ref 22–32)
Calcium: 8.9 mg/dL (ref 8.9–10.3)
Chloride: 103 mmol/L (ref 98–111)
Creatinine, Ser: 1.53 mg/dL — ABNORMAL HIGH (ref 0.61–1.24)
GFR, Estimated: 51 mL/min — ABNORMAL LOW (ref >60)
Glucose, Bld: 102 mg/dL — ABNORMAL HIGH (ref 70–99)
Potassium: 4.3 mmol/L (ref 3.5–5.1)
Sodium: 136 mmol/L (ref 135–145)

## 2023-06-01 NOTE — Progress Notes (Signed)
 Mobility Specialist Progress Note:    06/01/23 0908  Mobility  Activity Ambulated independently in room;Ambulated independently in hallway  Level of Assistance Standby assist, set-up cues, supervision of patient - no hands on  Assistive Device None  Distance Ambulated (ft) 470 ft  RUE Weight Bearing Per Provider Order NWB  LUE Weight Bearing Per Provider Order NWB  Activity Response Tolerated well  Mobility Referral Yes  Mobility visit 1 Mobility  Mobility Specialist Start Time (ACUTE ONLY) 0908  Mobility Specialist Stop Time (ACUTE ONLY) 0920  Mobility Specialist Time Calculation (min) (ACUTE ONLY) 12 min   Pt received in chair, agreeable to mobility session. Assited pt with donning clothing. Pt required no AD to ambulate. Slightly unsteady gait but this is baseline, no LOB. Tolerated well, asx throughout. Returned pt to room, sitting up in chair, HR 102 bpm. Left with all needs met.   Dorothy Polhemus Mobility Specialist Please contact via Special educational needs teacher or  Rehab office at [phone]

## 2023-06-01 NOTE — Progress Notes (Signed)
 Pt just ambulated with MT. Can now walk independently. Discussed with pt IS, sternal precautions, diet (mainly low sodium), exercise, daily wts, and CRPII. Pt receptive. Will refer to [facility]. He will walk on his own later.  [phone] German Koller BS, ACSM-CEP 06/01/2023 10:20 AM

## 2023-06-01 NOTE — Progress Notes (Addendum)
 Patient Name: Calvin Drake Date of Encounter: 06/01/2023 [facility] Cardiologist: Cody Das, MD  Interval Summary  .    Patient reports feeling well today.  He denies shortness of breath, lower extremity swelling, chest pain.  Vital Signs .    Vitals:   05/31/23 2314 06/01/23 0311 06/01/23 0604 06/01/23 0801  BP: 113/74 117/75  122/78  Pulse: 94 90  91  Resp: 19 17  15   Temp: 98.6 F (37 C) 98.2 F (36.8 C)  97.7 F (36.5 C)  TempSrc: Oral Oral  Oral  SpO2: 99% 99%  98%  Weight:   90.1 kg   Height:        Intake/Output Summary (Last 24 hours) at 06/01/2023 0859 Last data filed at 06/01/2023 0800 Gross per 24 hour  Intake --  Output 1875 ml  Net -1875 ml      06/01/2023    6:04 AM 05/31/2023    3:21 AM 05/30/2023    4:34 AM  Last 3 Weights  Weight (lbs) 198 lb 9.6 oz 201 lb 201 lb 15.1 oz  Weight (kg) 90.084 kg 91.173 kg 91.6 kg      Telemetry/ECG    Normal sinus rhythm, 1 three beat run of NSVT- Personally Reviewed  Physical Exam .   GEN: No acute distress.  Sitting upright in the armchair about to go for a walk with therapy Neck: No JVD Cardiac: RRR, no murmurs, rubs, or gallops.  Respiratory: Clear to auscultation bilaterally.  Normal work of breathing GI: Soft, nontender, non-distended  MS: No edema in bilateral lower extremities  Assessment & Plan .     Severe mitral regurgitation  - s/p  MVR using simuform semi-rigid annuloplasty 32 mm and closure of PFO by Dr Luna Salinas 05/23/23 - Echo 05/26/23 with LVEF 45-50%, no RWMA, grade II DD, mild LVH, normal RV, mild LAE, 32 mm prosthetic annuloplasty ring present in the mitral position, no MR, mild MS with mean gradient 5 mmHg  - post op care per CT surgery team  - 6-week echo and 2-week APP follow-up have both been arranged  Acute HFmrEF  History of RV failure  - Echocardiogram in 04/2023 showed EF 60-65%, grade 2 DD, severe biatrial enlargement, severe MR.  TEE 10/2023 showed EF  50-55%, severely reduced RV function, severe MR with multiple ruptured chordae and severe flail of the P3 scallop with involvement of P2 scallop. - Right/left heart cath 05/16/2023 showed 30% pLAD, 60% mLAD, 30% pRCA. LVEDP 28 mmHg. RA 25, RV 78/17, PA 73/35, mPAP 51, PCW 38, AO 93%, PA 50%, CO 3l/min, CI 1.47L/min/m2.   - Repeat RHC 05/20/23 (post milrinone  assisted diuresis) - low filling pressures; RA 1, PCWP 14-16. Cardiac index remains reduced by TD despite addition of milrinone  0.25mcg/kg/min - Underwent MVR 5/5 - Echocardiogram post MVR showed EF 45-50%, no wall motion abnormalities, grade II DD, normal RV systolic function - Off milrinone  since 5/11 - Lasix  had been held due to AKI with creatinine peaking at 2.84 on 5/10. Now improved to 1.53.  He was started on oral Lasix  20 mg daily.  He is euvolemic on exam today.  Stop Lasix .  Anticipate discharge with as needed Lasix  - GDMT limited by AKI. Baseline creatinine around 0.8-1.0. Plan to start ARB/ARNI, spiro, SGLT2i once renal function returns to baseline  - Continue carvedilol 3.125 mg BID  Nonobstructive CAD  - Noted on LHC  - On ASA 325 mg daily per CT surgery  - Continue  crestor  40 mg daily   Otherwise per primary  - AKI - Anemia  - Leukocytosis   For questions or updates, please contact [facility] Please consult [email or web address] for contact info under     Signed, Debria Fang, PA-C   Patient seen and examined, note reviewed with the signed Advanced Practice Provider. I personally reviewed laboratory data, imaging studies and relevant notes. I independently examined the patient and formulated the important aspects of the plan. I have personally discussed the plan with the patient and/or family. Comments or changes to the note/plan are indicated below.  Patient seen and examined his bedside.    Acute heart failure with mid-range ejection fraction  Hx of RV failure  Severe mitral MR s/p MVR Non-Obstructive  CAD Anemia    improving postoperatively. No off milrinone .  Clinically euvolemic - no need for diuretics at this time will use as needed . Clinically improving as expected post MVR. On Coreg 3.125 mg Bid now, once can tolerate will add other GDMT for optimization.  No angina symptoms at this time   Myeasha Ballowe DO, MS [facility] Attending Cardiologist [facility]  [address] [place], [place] [address] [phone] Website: [email or web address]

## 2023-06-01 NOTE — Progress Notes (Signed)
 [address]       [place] [address]             [phone]      9 Days Post-Op Procedure(s) (LRB): REPAIR, MITRAL VALVE USING SIMUFORM SEMI-RIGID ANNULOPLASTY RING , CLOSURE OF PATENT FORAMEN OVALE (N/A) ECHOCARDIOGRAM, TRANSESOPHAGEAL, INTRAOPERATIVE (N/A) Subjective: Patient sitting up in chair, no new complaints this AM.   Objective: Vital signs in last 24 hours: Temp:  [98.1 F (36.7 C)-98.6 F (37 C)] 98.2 F (36.8 C) (05/14 0311) Pulse Rate:  [90-104] 90 (05/14 0311) Cardiac Rhythm: Normal sinus rhythm (05/13 1900) Resp:  [17-23] 17 (05/14 0311) BP: (103-134)/(67-81) 117/75 (05/14 0311) SpO2:  [96 %-99 %] 99 % (05/14 0311) Weight:  [90.1 kg] 90.1 kg (05/14 0604)  Hemodynamic parameters for last 24 hours:    Intake/Output from previous day: 05/13 0701 - 05/14 0700 In: -  Out: 1175 [Urine:1175] Intake/Output this shift: No intake/output data recorded.  General appearance: alert, cooperative, and no distress Neurologic: intact Heart: regular rate and rhythm, S1, S2 normal, no murmur, click, rub or gallop Lungs: clear to auscultation bilaterally Abdomen: soft, non-tender; bowel sounds normal; no masses,  no organomegaly Extremities: compression stockings in place Wound: Clean and dry without sign of infection  Lab Results: Recent Labs    05/31/23 0425 06/01/23 0358  WBC 15.7* 16.5*  HGB 9.6* 9.9*  HCT 29.0* 30.3*  PLT 149* 170   BMET:  Recent Labs    05/31/23 0425 06/01/23 0358  NA 135 136  K 4.1 4.3  CL 102 103  CO2 25 26  GLUCOSE 101* 102*  BUN 26* 22  CREATININE 1.64* 1.53*  CALCIUM  8.7* 8.9    PT/INR: No results for input(s): "LABPROT", "INR" in the last 72 hours. ABG    Component Value Date/Time   PHART 7.375 05/23/2023 1957   HCO3 17.7 (L) 05/23/2023 1957   TCO2 19 (L) 05/23/2023 1957   ACIDBASEDEF 7.0 (H) 05/23/2023 1957   O2SAT 76.9 05/30/2023 0423   CBG (last 3)  Recent Labs    05/31/23 1538  05/31/23 2107 06/01/23 0602  GLUCAP 132* 110* 96    Assessment/Plan: S/P Procedure(s) (LRB): REPAIR, MITRAL VALVE USING SIMUFORM SEMI-RIGID ANNULOPLASTY RING , CLOSURE OF PATENT FORAMEN OVALE (N/A) ECHOCARDIOGRAM, TRANSESOPHAGEAL, INTRAOPERATIVE (N/A)  CV: HFpEF, AHF signed off, plans for gen cards to follow and add GDMT as able. Appreciate their assistance. SBP 110s-130 on Coreg 3.125. NSR-ST, HR 80s-105. 1 3 beat run of NSVT, asymptomatic.    Pulm: Saturating well on RA. Last CXR with minimal basilar atelectasis without consolidation. Encourage IS and ambulation.    GI: +BM, tolerating a diet.    Endo: Preop A1C 5.9, likely prediabetic. CBGs controlled on SSI. Will d/c SSI and CBGs. Patient will need to follow up closely with PCP.    Renal: AKI likely due to ATN. Cr improved 1.53 this AM. UO 1175cc/24hrs. +13lbs from preop. On Lasix  20mg  daily. K 4.3, continue K supplement.    ID: Slight increase in leukocytosis, WBC 16.5 this AM. Afebrile. No urinary complaints, cough, or sign of infection at wound. Will order a UA this AM. Patient does have PICC in place, may be able to d/c today.    Expected postop ABLA: H/H 9.9/30.3, stable. Not clinically significant at this time. Likely reactive thrombocytopenia resolved    DVT Prophylaxis: Lovenox   Dispo: Hopefully can d/c tomorrow AM if he remains stable and WBC starts trending down.  LOS: 16 days    Randa Burton, PA-C 06/01/2023

## 2023-06-01 NOTE — Progress Notes (Signed)
 CVAD removed per protocol per MD order. Manual pressure applied for 3 mins. Vaseline gauze, gauze, and Tegaderm applied over insertion site. No bleeding or swelling noted. Instructed patient to remain in bed for thirty mins. Educated patient about S/S of infection and when to call MD; no heavy lifting or pressure on R side for 24 hours; keep dressing dry and intact for 24 hours. Pt verbalized comprehension.

## 2023-06-02 ENCOUNTER — Other Ambulatory Visit (HOSPITAL_COMMUNITY): Payer: Self-pay

## 2023-06-02 ENCOUNTER — Telehealth (HOSPITAL_COMMUNITY): Payer: Self-pay

## 2023-06-02 LAB — BASIC METABOLIC PANEL WITH GFR
Anion gap: 7 (ref 5–15)
BUN: 22 mg/dL (ref 8–23)
CO2: 25 mmol/L (ref 22–32)
Calcium: 9.2 mg/dL (ref 8.9–10.3)
Chloride: 104 mmol/L (ref 98–111)
Creatinine, Ser: 1.24 mg/dL (ref 0.61–1.24)
GFR, Estimated: >60 mL/min (ref >60)
Glucose, Bld: 90 mg/dL (ref 70–99)
Potassium: 4.6 mmol/L (ref 3.5–5.1)
Sodium: 136 mmol/L (ref 135–145)

## 2023-06-02 LAB — CBC
HCT: 31.4 % — ABNORMAL LOW (ref 39.0–52.0)
Hemoglobin: 10 g/dL — ABNORMAL LOW (ref 13.0–17.0)
MCH: 28.1 pg (ref 26.0–34.0)
MCHC: 31.8 g/dL (ref 30.0–36.0)
MCV: 88.2 fL (ref 80.0–100.0)
Platelets: 185 10*3/uL (ref 150–400)
RBC: 3.56 MIL/uL — ABNORMAL LOW (ref 4.22–5.81)
RDW: 14.7 % (ref 11.5–15.5)
WBC: 17 10*3/uL — ABNORMAL HIGH (ref 4.0–10.5)
nRBC: 0 % (ref 0.0–0.2)

## 2023-06-02 MED ORDER — EMPAGLIFLOZIN 10 MG PO TABS
10.0000 mg | ORAL_TABLET | Freq: Every day | ORAL | 3 refills | Status: DC
  Filled 2023-06-02 – 2023-06-27 (×3): qty 30, 30d supply, fill #0

## 2023-06-02 MED ORDER — CARVEDILOL 3.125 MG PO TABS
3.1250 mg | ORAL_TABLET | Freq: Two times a day (BID) | ORAL | 1 refills | Status: DC
  Filled 2023-06-02 – 2023-06-27 (×2): qty 60, 30d supply, fill #0

## 2023-06-02 MED ORDER — FUROSEMIDE 40 MG PO TABS
40.0000 mg | ORAL_TABLET | Freq: Once | ORAL | Status: AC
  Administered 2023-06-02: 40 mg via ORAL
  Filled 2023-06-02: qty 1

## 2023-06-02 MED ORDER — OXYCODONE HCL 5 MG PO TABS
5.0000 mg | ORAL_TABLET | Freq: Four times a day (QID) | ORAL | 0 refills | Status: DC | PRN
Start: 2023-06-02 — End: 2023-06-15
  Filled 2023-06-02: qty 28, 7d supply, fill #0

## 2023-06-02 MED ORDER — EMPAGLIFLOZIN 10 MG PO TABS
10.0000 mg | ORAL_TABLET | Freq: Every day | ORAL | Status: DC
  Administered 2023-06-02: 10 mg via ORAL
  Filled 2023-06-02: qty 1

## 2023-06-02 MED ORDER — FUROSEMIDE 20 MG PO TABS
20.0000 mg | ORAL_TABLET | Freq: Every day | ORAL | 1 refills | Status: DC
Start: 2023-06-02 — End: 2023-07-14
  Filled 2023-06-02 – 2023-06-27 (×2): qty 30, 30d supply, fill #0

## 2023-06-02 MED ORDER — ROSUVASTATIN CALCIUM 40 MG PO TABS
40.0000 mg | ORAL_TABLET | Freq: Every day | ORAL | 1 refills | Status: DC
  Filled 2023-06-02 – 2023-06-27 (×2): qty 30, 30d supply, fill #0

## 2023-06-02 NOTE — Telephone Encounter (Signed)
 PAP: Application for [place] [employer] has been submitted to [employer]), via fax

## 2023-06-02 NOTE — TOC Transition Note (Signed)
 Transition of Care (TOC) - Discharge Note Sherin Dingwall RN, BSN Transitions of Care Unit 4E- RN Case Manager See Treatment Team for direct phone #   Patient Details  Name: Calvin Drake MRN: [id number] Date of Birth: 04-27-1960  Transition of Care [facility]) CM/SW Contact:  Rox Cope, RN Phone Number: 06/02/2023, 12:36 PM   Clinical Narrative:    Pt stable for transition home today, RW was delivered to room by HF CM on 5/13- bedside RN has brought RW back to CM stating pt no longer wants RW for home and is returning it.   MATCH letter done per HF CM- TOC pharmacy to assist with medications. Pt will also go home on Jardiance- TOC pharmacy to fill using coupon for first 30 days, Pt will need to f/u with HF clinic for pt assistance program.   Pt has transportation home.   No further TOC intervention needs noted.    Final next level of care: Home/Self Care Barriers to Discharge: No Barriers Identified   Patient Goals and CMS Choice Patient states their goals for this hospitalization and ongoing recovery are:: wants to remain independent          Discharge Placement               Home        Discharge Plan and Services Additional resources added to the After Visit Summary for     Discharge Planning Services: CM Consult, Medication Assistance, MATCH Program            DME Arranged: Walker rolling DME Agency: [employer] Date DME Agency Contacted: 05/31/23 Time DME Agency Contacted: 1345 Representative spoke with at DME Agency: Mitch            Social Drivers of Health (SDOH) Interventions SDOH Screenings   Food Insecurity: No Food Insecurity (05/17/2023)  Housing: Low Risk  (05/17/2023)  Transportation Needs: No Transportation Needs (05/17/2023)  Utilities: Not At Risk (05/17/2023)  Tobacco Use: Low Risk  (05/23/2023)     Readmission Risk Interventions    06/02/2023   12:36 PM  Readmission Risk Prevention Plan  Post Dischage Appt Complete   Medication Screening Complete  Transportation Screening Complete

## 2023-06-02 NOTE — Progress Notes (Signed)
 CARDIAC REHAB PHASE I   Pt ready for discharge home. Postop [facility] education completed. Referral for CRP2 sent to [facility]. No questions or concerns.   [id number] Ronny Colas, RN BSN 06/02/2023 10:59 AM

## 2023-06-02 NOTE — Progress Notes (Signed)
 AVS reviewed and patient expressed understanding; all questions answered.  Patient discharged with Vol . Transports; to pick up TOC meds on way to front entrance.

## 2023-06-02 NOTE — Progress Notes (Addendum)
      [address]       [place] [address]             [phone]        10 Days Post-Op Procedure(s) (LRB): REPAIR, MITRAL VALVE USING SIMUFORM SEMI-RIGID ANNULOPLASTY RING , CLOSURE OF PATENT FORAMEN OVALE (N/A) ECHOCARDIOGRAM, TRANSESOPHAGEAL, INTRAOPERATIVE (N/A)  Subjective: Patient just finished breakfast. He had another bowel movement. He has no complaints and wants to go home.  Objective: Vital signs in last 24 hours: Temp:  [97.7 F (36.5 C)-98.2 F (36.8 C)] 98.1 F (36.7 C) (05/15 0420) Pulse Rate:  [90-98] 98 (05/15 0420) Cardiac Rhythm: Normal sinus rhythm (05/14 1900) Resp:  [15-22] 19 (05/15 0420) BP: (93-122)/(60-78) 112/77 (05/15 0420) SpO2:  [93 %-100 %] 94 % (05/15 0420) Weight:  [89.1 kg] 89.1 kg (05/15 0420)  Pre op weight 89.1 kg Current Weight  06/02/23 89.1 kg      Intake/Output from previous day: 05/14 0701 - 05/15 0700 In: -  Out: 1400 [Urine:1400]   Physical Exam:  Cardiovascular: RRR Pulmonary: Clear to auscultation bilaterally Abdomen: Soft, non tender, bowel sounds present. Extremities: Compression stockings in place Wound: Clean and dry.  No erythema or signs of infection.  Lab Results: CBC: Recent Labs    06/01/23 0358 06/02/23 0413  WBC 16.5* 17.0*  HGB 9.9* 10.0*  HCT 30.3* 31.4*  PLT 170 185   BMET:  Recent Labs    06/01/23 0358 06/02/23 0413  NA 136 136  K 4.3 4.6  CL 103 104  CO2 26 25  GLUCOSE 102* 90  BUN 22 22  CREATININE 1.53* 1.24  CALCIUM  8.9 9.2    PT/INR:  Lab Results  Component Value Date   INR 1.8 (H) 05/23/2023   INR 1.1 05/23/2023   ABG:  INR: Will add last result for INR, ABG once components are confirmed Will add last 4 CBG results once components are confirmed  Assessment/Plan:  1. CV - SR. On Coreg 3.125 mg bid 2.  Pulmonary - On room air. Encourage incentive spirometer. 3.  Expected post op acute blood loss anemia - H and H this am stable at 10 and  31.4 4. Leukocytosis-WBC 17,000;? Etiology. He remains afebrile and no sign of wound infection. PICC line removed yesterday. UA showed rare bacteria, negative for leukocytes and 0-5 WBC. 5. AKI-creatinine decreased from 1.53 to 1.24 this am. Could consider GDMT as creatinine now normal;will defer to cardiology. 6. CBGs 110/96/106. Pre op HGA1C 5.9. He likely has pre diabetes. Will provide nutrition information with discharge paperwork and he will need to follow up with PCP after discharge 7. Disposition-will discuss with Dr. Luna Salinas. Patient is feeling well and wants to go home.   Donielle M ZimmermanPA-C 7:26 AM Patient seen and examined, agree with above WBC elevated but no definite source- likely PICC line which is now out. No issues with wound, old IV sites, etc. and afebrile. Will dc home today.  He knows to call immediately if he has any fever or other issues  Milon Aloe. Luna Salinas, MD [facility] [phone]

## 2023-06-09 ENCOUNTER — Telehealth (HOSPITAL_COMMUNITY): Payer: Self-pay

## 2023-06-09 NOTE — Telephone Encounter (Signed)
 Faxed outside referral for Phase II Cardiac Rehab to [facility].

## 2023-06-14 NOTE — Progress Notes (Unsigned)
 Cardiology Clinic Note   Patient Name: Calvin Drake Date of Encounter: 06/15/2023  Primary Care Provider:  [facility] Primary Cardiologist:  Cody Das, MD  Patient Profile    Calvin Drake 63 year old male presents to the clinic today for follow-up evaluation post MVR.  Past Medical History    Past Medical History:  Diagnosis Date   Abnormal level of hormones in specimens from male genital organs    CHF (congestive heart failure) (HCC)    Past Surgical History:  Procedure Laterality Date   INTRAOPERATIVE TRANSESOPHAGEAL ECHOCARDIOGRAM N/A 05/23/2023   Procedure: ECHOCARDIOGRAM, TRANSESOPHAGEAL, INTRAOPERATIVE;  Surgeon: Zelphia Higashi, MD;  Location: [facility] OR;  Service: Open Heart Surgery;  Laterality: N/A;   MITRAL VALVE REPAIR N/A 05/23/2023   Procedure: REPAIR, MITRAL VALVE USING SIMUFORM SEMI-RIGID ANNULOPLASTY RING , CLOSURE OF PATENT FORAMEN OVALE;  Surgeon: Zelphia Higashi, MD;  Location: [facility] OR;  Service: Open Heart Surgery;  Laterality: N/A;   RIGHT HEART CATH N/A 05/20/2023   Procedure: RIGHT HEART CATH;  Surgeon: Alwin Baars, DO;  Location: [facility] INVASIVE CV LAB;  Service: Cardiovascular;  Laterality: N/A;   RIGHT/LEFT HEART CATH AND CORONARY ANGIOGRAPHY N/A 05/16/2023   Procedure: RIGHT/LEFT HEART CATH AND CORONARY ANGIOGRAPHY;  Surgeon: Cody Das, MD;  Location: [facility] INVASIVE CV LAB;  Service: Cardiovascular;  Laterality: N/A;   TRANSESOPHAGEAL ECHOCARDIOGRAM (CATH LAB) N/A 05/16/2023   Procedure: TRANSESOPHAGEAL ECHOCARDIOGRAM;  Surgeon: Luana Rumple, MD;  Location: [facility] INVASIVE CV LAB;  Service: Cardiovascular;  Laterality: N/A;    Allergies  No Known Allergies  History of Present Illness    [name] has a PMH of severe mitral valve regurgitation, HFmrEF, RV failure, nonobstructive CAD, AKI, anemia, and leukocytosis.  He was admitted on 05/16/2023 and discharged on 06/02/2023.  He underwent MVR by  Dr. Luna Salinas and closure of PFO on 05/23/2023.  His echocardiogram 05/26/2023 showed an LVEF of 45-50%, G2 DD, normal RV function, and mild LAE.  He was noted to have 32 mm prosthetic annuloplasty ring in the mitral position with no MR and mild MS.  His mean gradient was 5 mmHg.  Postoperative echocardiogram at 6 weeks was ordered.  He was weaned off milrinone  05/29/2023.  His Lasix  was held due to AKI and his creatinine peaked around 2.84.  This was on 05/28/2023.  It improved to 1.53.  GDMT was limited due to AKI.  Baseline creatinine was around 0.8-1.0.  Plan was made to start ARB Arni, Spyro, SGLT2 inhibitor once renal function returned to baseline.  He was placed on carvedilol  3.125 mg twice daily.  His LHC noted nonobstructive CAD and he was started on aspirin  325 mg daily by CT surgery.  He was also started on rosuvastatin  40 mg daily.  He presents to the clinic today for follow-up evaluation and states he had an episode of presyncope at home.  He noted that he was walking around his house which is a large distance.  He described this as 10 times a circle of 2 heart unit.  He also has an incline on his loop.  He was attempting to walk up the loop and his legs became weak.  His wife lowered him to the ground.  She noted that he had forearm tenting.  His EKG today shows sinus rhythm 88 bpm.  His blood pressure is 118/60.  It appears that he is mildly dehydrated.  We reviewed his medications and his surgery.  He and his wife expressed understanding.  He is euvolemic today.  We are reviewed the importance of eating regularly, slowly progressing physical activity, and maintaining hydration.  I will change his Lasix  to every other day, repeat his fasting lipids and LFTs in 2-3 weeks and plan follow-up as scheduled with cardiothoracic surgery.  He also noted a brief episode of right facial numbness that lasted for minutes and dissipated after taking aspirin .  He reported that he had not been taking aspirin  as  prescribed.  I recommended that he restart this.  I will plan follow-up with me or Dr. Filiberto Hug in 2 months.  Today he denies chest pain, shortness of breath, lower extremity edema, fatigue, palpitations, melena, hematuria, hemoptysis, diaphoresis, weakness, presyncope, syncope, orthopnea, and PND.   Home Medications    Prior to Admission medications   Medication Sig Start Date End Date Taking? Authorizing Provider  aspirin  EC 325 MG tablet Take 325 mg by mouth daily as needed (feeling bad).    [provider]  carvedilol  (COREG ) 3.125 MG tablet Take 1 tablet (3.125 mg total) by mouth 2 (two) times daily with a meal. 06/02/23   Barrett, Erin R, PA-C  empagliflozin  (JARDIANCE ) 10 MG TABS tablet Take 1 tablet (10 mg total) by mouth daily. 06/02/23   Barrett, Erin R, PA-C  furosemide  (LASIX ) 20 MG tablet Take 1 tablet (20 mg total) by mouth daily. 06/02/23   Barrett, Malachy Scripture, PA-C  oxyCODONE  (OXY IR/ROXICODONE ) 5 MG immediate release tablet Take 1 tablet (5 mg total) by mouth every 6 (six) hours as needed for severe pain (pain score 7-10). 06/02/23   Barrett, Erin R, PA-C  rosuvastatin  (CRESTOR ) 40 MG tablet Take 1 tablet (40 mg total) by mouth daily. 06/02/23   Barrett, Malachy Scripture, PA-C    Family History    No family history on file. has no family status information on file.   Social History    Social History   Socioeconomic History   Marital status: Married    Spouse name: Not on file   Number of children: Not on file   Years of education: Not on file   Highest education level: Not on file  Occupational History   Not on file  Tobacco Use   Smoking status: Never   Smokeless tobacco: Never  Substance and Sexual Activity   Alcohol use: Not on file   Drug use: Not on file   Sexual activity: Not on file  Other Topics Concern   Not on file  Social History Narrative   Not on file   Social Drivers of Health   Financial Resource Strain: Not on file  Food Insecurity: No Food  Insecurity (05/17/2023)   Hunger Vital Sign    Worried About Running Out of Food in the Last Year: Never true    Ran Out of Food in the Last Year: Never true  Transportation Needs: No Transportation Needs (05/17/2023)   PRAPARE - Administrator, Civil Service (Medical): No    Lack of Transportation (Non-Medical): No  Physical Activity: Not on file  Stress: Not on file  Social Connections: Not on file  Intimate Partner Violence: Not At Risk (05/17/2023)   Humiliation, Afraid, Rape, and Kick questionnaire    Fear of Current or Ex-Partner: No    Emotionally Abused: No    Physically Abused: No    Sexually Abused: No     Review of Systems    General:  No chills, fever, night sweats or weight changes.  Cardiovascular:  No chest pain, dyspnea on exertion, edema, orthopnea, palpitations, paroxysmal nocturnal dyspnea. Dermatological: No rash, lesions/masses Respiratory: No cough, dyspnea Urologic: No hematuria, dysuria Abdominal:   No nausea, vomiting, diarrhea, bright red blood per rectum, melena, or hematemesis Neurologic:  No visual changes, wkns, changes in mental status. All other systems reviewed and are otherwise negative except as noted above.  Physical Exam    VS:  BP 118/60   Pulse 88   Ht 5\' 7"  (1.702 m)   Wt 192 lb 6.4 oz (87.3 kg)   SpO2 98%   BMI 30.13 kg/m  , BMI Body mass index is 30.13 kg/m. GEN: Well nourished, well developed, in no acute distress. HEENT: normal. Neck: Supple, no JVD, carotid bruits, or masses. Cardiac: RRR, no murmurs, rubs, or gallops. No clubbing, cyanosis, edema.  Radials/DP/PT 2+ and equal bilaterally.  Respiratory:  Respirations regular and unlabored, clear to auscultation bilaterally. GI: Soft, nontender, nondistended, BS + x 4. MS: no deformity or atrophy. Skin: warm and dry, no rash. Neuro:  Strength and sensation are intact. Psych: Normal affect.  Accessory Clinical Findings    Recent Labs: 04/18/2023: BNP 566.1; TSH  3.390 05/23/2023: ALT 42 05/24/2023: Magnesium  2.3 06/02/2023: BUN 22; Creatinine, Ser 1.24; Hemoglobin 10.0; Platelets 185; Potassium 4.6; Sodium 136   Recent Lipid Panel No results found for: "CHOL", "TRIG", "HDL", "CHOLHDL", "VLDL", "LDLCALC", "LDLDIRECT"       ECG personally reviewed by me today- EKG Interpretation Date/Time:  Wednesday Jun 15 2023 13:45:22 EDT Ventricular Rate:  88 PR Interval:  146 QRS Duration:  82 QT Interval:  362 QTC Calculation: 438 R Axis:   -24  Text Interpretation: Normal sinus rhythm Normal ECG When compared with ECG of 27-May-2023 07:34, Nonspecific T wave abnormality now evident in Lateral leads Confirmed by Lawana Pray [phone]) on 06/15/2023 1:52:37 PM    Echocardiogram 05/26/2023  IMPRESSIONS     1. Left ventricular ejection fraction, by estimation, is 45 to 50%. The  left ventricle has mildly decreased function. The left ventricle has no  regional wall motion abnormalities. There is mild concentric left  ventricular hypertrophy. Left ventricular  diastolic parameters are consistent with Grade II diastolic dysfunction  (pseudonormalization).   2. Right ventricular systolic function is normal. The right ventricular  size is normal.   3. Left atrial size was mildly dilated.   4. The mitral valve has been repaired/replaced. No evidence of mitral  valve regurgitation. Mild mitral stenosis. The mean mitral valve gradient  is 5.0 mmHg. There is a 32 mm prosthetic annuloplasty ring present in the  mitral position. Procedure Date:  05/23/23.   5. The aortic valve is tricuspid. Aortic valve regurgitation is not  visualized. No aortic stenosis is present.   6. The inferior vena cava is normal in size with greater than 50%  respiratory variability, suggesting right atrial pressure of 3 mmHg.   FINDINGS   Left Ventricle: Left ventricular ejection fraction, by estimation, is 45  to 50%. The left ventricle has mildly decreased function. The left   ventricle has no regional wall motion abnormalities. The left ventricular  internal cavity size was normal in  size. There is mild concentric left ventricular hypertrophy. Left  ventricular diastolic parameters are consistent with Grade II diastolic  dysfunction (pseudonormalization).   Right Ventricle: The right ventricular size is normal. No increase in  right ventricular wall thickness. Right ventricular systolic function is  normal.   Left Atrium: Left atrial size was mildly dilated.   Right  Atrium: Right atrial size was normal in size.   Pericardium: There is no evidence of pericardial effusion.   Mitral Valve: The mitral valve has been repaired/replaced. No evidence of  mitral valve regurgitation. There is a 32 mm prosthetic annuloplasty ring  present in the mitral position. Procedure Date: 05/23/23. Mild mitral valve  stenosis. MV peak gradient, 13.5   mmHg. The mean mitral valve gradient is 5.0 mmHg.   Tricuspid Valve: The tricuspid valve is normal in structure. Tricuspid  valve regurgitation is mild . No evidence of tricuspid stenosis.   Aortic Valve: The aortic valve is tricuspid. Aortic valve regurgitation is  not visualized. No aortic stenosis is present.   Pulmonic Valve: The pulmonic valve was normal in structure. Pulmonic valve  regurgitation is trivial. No evidence of pulmonic stenosis.   Aorta: The aortic root is normal in size and structure.   Venous: The inferior vena cava is normal in size with greater than 50%  respiratory variability, suggesting right atrial pressure of 3 mmHg.   IAS/Shunts: No atrial level shunt detected by color flow Doppler.   LHC 05/16/2023    Prox LAD lesion is 30% stenosed.   Mid LAD lesion is 60% stenosed.   Prox RCA lesion is 30% stenosed.   Recommend Aspirin  81mg  daily for moderate CAD.   Coronary angiography 05/16/2023: LM: Normal LAD: Prox 30%, mid 60% LAD stenoses Lcx: No significant disease RCA: Prox 30% disease    LVEDP 28 mmnHg   Right heart catheterization 05/16/2023: RA: 25 mmHg RV: 78/17 mmHg PA: 73/35 mmHg, mPAP 51 mmHg PCW: 38 mmHg   AO sats: 93% PA sats: 50%   CO: 3.0 L/min CI: 1.47 L/min/m2    Moderate nonobstructive coronary artery disease Severe mitral regurgitation with resultant severe pulmonary hypertension with RV failure Recommend admission for IV diuresis and continued workup for management of severe mitral regurgitation   Cody Das, MD    Assessment & Plan   1.  Severe mitral valve regurgitation-had episode of presyncope with increasing physical activity.  He is status post PFO closure and semirigid angioplasty MVR on 05/23/2023. Maintain p.o. hydration Continue to slowly increase physical activity Heart healthy low-sodium diet Continue carvedilol  Proceed to follow-up echocardiogram  Acute systolic CHF-weight today .  Euvolemic.  EF 60 to 65% on 4/25.  Follow-up echocardiogram post MVR showed an LVEF of 45-50%, G2 DD, and normal RV function.  Will hold on up titration of GDMT at this time due to episode of presyncope. Continue carvedilol , furosemide  Repeat BMP   Coronary artery disease-no chest pain today.  Denies recent episodes of anginal type symptoms.  Nonobstructive plaque noted on [facility] 05/16/2023. Continue carvedilol , aspirin , rosuvastatin   Hyperlipidemia-compliant with statin. High-fiber diet Continue aspirin , rosuvastatin  Repeat fasting lipids and LFTs  AKI-creatinine 1.24 on 06/02/2023 Repeat BMP  Disposition: Follow-up with Dr. Filiberto Hug or me in 1-2 months.   Chet Cota. Zarian Colpitts NP-C     06/15/2023, 1:54 PM [facility] [address] Office [phone] Fax [phone]    I spent 15 minutes examining this patient, reviewing medications, and using patient centered shared decision making involving their cardiac care.   I spent  20 minutes reviewing past medical history,  medications, and prior  cardiac tests.

## 2023-06-15 ENCOUNTER — Encounter: Payer: Self-pay | Admitting: General Practice

## 2023-06-15 ENCOUNTER — Ambulatory Visit: Payer: Self-pay | Attending: General Practice | Admitting: General Practice

## 2023-06-15 VITALS — BP 118/60 | HR 88 | Ht 67.0 in | Wt 192.4 lb

## 2023-06-15 DIAGNOSIS — N179 Acute kidney failure, unspecified: Secondary | ICD-10-CM

## 2023-06-15 DIAGNOSIS — I2510 Atherosclerotic heart disease of native coronary artery without angina pectoris: Secondary | ICD-10-CM

## 2023-06-15 DIAGNOSIS — I5021 Acute systolic (congestive) heart failure: Secondary | ICD-10-CM

## 2023-06-15 DIAGNOSIS — E782 Mixed hyperlipidemia: Secondary | ICD-10-CM

## 2023-06-15 DIAGNOSIS — Z9889 Other specified postprocedural states: Secondary | ICD-10-CM

## 2023-06-15 DIAGNOSIS — I340 Nonrheumatic mitral (valve) insufficiency: Secondary | ICD-10-CM

## 2023-06-15 NOTE — Patient Instructions (Addendum)
 Medication Instructions:  Furosemide  (Lasix ) 20 mg- take 1 tablet every other day.  *If you need a refill on your cardiac medications before your next appointment, please call your pharmacy*  Lab Work: BNP, CBC today Lipd panel & LFTs 2-3 weeks  Testing/Procedures: NONE ordered at this time of appointment  Follow-Up: At [facility], you and your health needs are our priority.  As part of our continuing mission to provide you with exceptional heart care, our providers are all part of one team.  This team includes your primary Cardiologist (physician) and Advanced Practice Providers or APPs (Physician Assistants and Nurse Practitioners) who all work together to provide you with the care you need, when you need it.  Your next appointment:   2 month(s)  Provider:   Lawana Pray, NP          We recommend signing up for the patient portal called "MyChart".  Sign up information is provided on this After Visit Summary.  MyChart is used to connect with patients for Virtual Visits (Telemedicine).  Patients are able to view lab/test results, encounter notes, upcoming appointments, etc.  Non-urgent messages can be sent to your provider as well.   To learn more about what you can do with MyChart, go to [email or web address].   Other Instructions Increase physical activity as tolerated.

## 2023-06-16 ENCOUNTER — Ambulatory Visit: Payer: Self-pay | Admitting: General Practice

## 2023-06-16 LAB — BASIC METABOLIC PANEL WITH GFR
BUN/Creatinine Ratio: 21 (ref 10–24)
BUN: 26 mg/dL (ref 8–27)
CO2: 24 mmol/L (ref 20–29)
Calcium: 9.8 mg/dL (ref 8.6–10.2)
Chloride: 98 mmol/L (ref 96–106)
Creatinine, Ser: 1.24 mg/dL (ref 0.76–1.27)
Glucose: 95 mg/dL (ref 70–99)
Potassium: 5.2 mmol/L (ref 3.5–5.2)
Sodium: 139 mmol/L (ref 134–144)
eGFR: 66 mL/min/{1.73_m2} (ref >59)

## 2023-06-16 LAB — CBC
Hematocrit: 38.9 % (ref 37.5–51.0)
Hemoglobin: 12.2 g/dL — ABNORMAL LOW (ref 13.0–17.7)
MCH: 28 pg (ref 26.6–33.0)
MCHC: 31.4 g/dL — ABNORMAL LOW (ref 31.5–35.7)
MCV: 89 fL (ref 79–97)
Platelets: 255 10*3/uL (ref 150–450)
RBC: 4.35 x10E6/uL (ref 4.14–5.80)
RDW: 13.1 % (ref 11.6–15.4)
WBC: 8.9 10*3/uL (ref 3.4–10.8)

## 2023-06-22 ENCOUNTER — Encounter: Payer: Self-pay | Admitting: Surgery

## 2023-06-24 ENCOUNTER — Other Ambulatory Visit: Payer: Self-pay | Admitting: Thoracic Surgery (Cardiothoracic Vascular Surgery)

## 2023-06-24 DIAGNOSIS — Z9889 Other specified postprocedural states: Secondary | ICD-10-CM

## 2023-06-27 ENCOUNTER — Other Ambulatory Visit (HOSPITAL_COMMUNITY): Payer: Self-pay

## 2023-06-28 ENCOUNTER — Ambulatory Visit
Payer: Self-pay | Attending: Thoracic Surgery (Cardiothoracic Vascular Surgery) | Admitting: Thoracic Surgery (Cardiothoracic Vascular Surgery)

## 2023-06-28 ENCOUNTER — Ambulatory Visit (HOSPITAL_COMMUNITY)
Admission: RE | Admit: 2023-06-28 | Discharge: 2023-06-28 | Disposition: A | Discharge status: Home / Self Care | Payer: Self-pay | Source: Ambulatory Visit | Attending: Cardiovascular Disease | Admitting: Cardiovascular Disease

## 2023-06-28 ENCOUNTER — Other Ambulatory Visit (HOSPITAL_COMMUNITY): Payer: Self-pay

## 2023-06-28 ENCOUNTER — Encounter: Payer: Self-pay | Admitting: Thoracic Surgery (Cardiothoracic Vascular Surgery)

## 2023-06-28 VITALS — BP 104/65 | HR 85 | Resp 20 | Ht 67.0 in | Wt 195.0 lb

## 2023-06-28 DIAGNOSIS — Z9889 Other specified postprocedural states: Secondary | ICD-10-CM

## 2023-06-28 DIAGNOSIS — Z952 Presence of prosthetic heart valve: Secondary | ICD-10-CM | POA: Insufficient documentation

## 2023-06-28 NOTE — Progress Notes (Signed)
 [address] [place] [address]             [phone]     HPI: Calvin Drake returns for follow-up after recent mitral valve repair.  Calvin Drake is a 63 year old man who presented with heart failure.  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  He underwent mitral valve repair with artificial cords on 05/25/2023.  He went home on postoperative day #10.  Overall he feels well.  Is not having much pain.  He is not taking anything for pain.  He has been working about 4 hours a day.  No shortness of breath or peripheral edema.  He does complain of feeling very tired and fatigued if he takes his medications in the morning.  He is not sure which medication it is, but he does not have that feeling if he waits and takes medications later.  Past Medical History:  Diagnosis Date   Abnormal level of hormones in specimens from male genital organs    CHF (congestive heart failure) (HCC)     Current Outpatient Medications  Medication Sig Dispense Refill   aspirin  EC 325 MG tablet Take 325 mg by mouth daily as needed (feeling bad).     carvedilol  (COREG ) 3.125 MG tablet Take 1 tablet (3.125 mg total) by mouth 2 (two) times daily with a meal. 60 tablet 1   empagliflozin  (JARDIANCE ) 10 MG TABS tablet Take 1 tablet (10 mg total) by mouth daily. 30 tablet 3   furosemide  (LASIX ) 20 MG tablet Take 1 tablet (20 mg total) by mouth daily. (Patient taking differently: Take 20 mg by mouth every other day.) 60 tablet 1   rosuvastatin  (CRESTOR ) 40 MG tablet Take 1 tablet (40 mg total) by mouth daily. 30 tablet 1   No current facility-administered medications for this visit.    Physical Exam BP 104/65   Pulse 85   Resp 20   Ht 5\' 7"  (1.702 m)   Wt 195 lb (88.5 kg)   SpO2 97%   BMI 30.70 kg/m  63 year old man in no acute distress Alert and oriented x 3 with no deficits Cardiac regular rate and rhythm with normal S1 and S2, no rub  or murmur Lungs clear with equal breath sounds bilaterally Incision clean dry and intact, sternum stable No peripheral edema  Diagnostic Tests: I personally reviewed his chest x-ray images.  Postoperative changes.  No effusions or infiltrates.  Impression: Calvin Drake is a 63 year old man who presented with new onset heart failure due to severe mitral regurgitation secondary to ruptured chordae tendon knee.  He underwent mitral valve repair about a month ago.  Overall he is doing extremely well.  He is not having much pain.  Exercise tolerance is good.  He should not lift anything over 10 pounds until it has been 6 weeks from his surgery.  He may begin driving on a limited basis.  Appropriate precautions were discussed.  Complains of fatigue that he thinks is due to the medications.  I advised him to try serially holding one of his medications at a time for a few days to see if that resolves.  He does noted to take prophylactic antibiotics for any dental procedures  Plan: Follow-up with cardiology I will be happy to see Calvin Drake back anytime in the future if I can be of any further assistance with his care  Zelphia Higashi,  MD [facility] [phone]

## 2023-07-04 ENCOUNTER — Ambulatory Visit (HOSPITAL_COMMUNITY)
Admission: RE | Admit: 2023-07-04 | Discharge: 2023-07-04 | Disposition: A | Discharge status: Home / Self Care | Payer: Self-pay | Source: Ambulatory Visit | Attending: Cardiology | Admitting: Cardiology

## 2023-07-04 DIAGNOSIS — Z952 Presence of prosthetic heart valve: Secondary | ICD-10-CM

## 2023-07-04 DIAGNOSIS — Z9889 Other specified postprocedural states: Secondary | ICD-10-CM | POA: Insufficient documentation

## 2023-07-04 LAB — ECHOCARDIOGRAM COMPLETE
Area-P 1/2: 3.73 cm2
MV VTI: 1.4 cm2
S' Lateral: 3.6 cm

## 2023-07-05 ENCOUNTER — Telehealth: Payer: Self-pay | Admitting: *Deleted

## 2023-07-05 ENCOUNTER — Ambulatory Visit: Payer: Self-pay | Admitting: Cardiology

## 2023-07-05 NOTE — Telephone Encounter (Signed)
 Called and gave patient result of ECHO. Patient requested sooner appt with provider to discuss ECHO results. Patient requested  earlier visit to discuss chest soreness and shortness of breath and results of ECHO. Patient requested to cancel appt scheduled 07/2023.  Made patient aware to go to Emergency Room if symptoms resume  or are worse. Understanding verbalized.

## 2023-07-14 ENCOUNTER — Encounter: Payer: Self-pay | Admitting: *Deleted

## 2023-07-14 ENCOUNTER — Encounter: Payer: Self-pay | Admitting: Cardiology

## 2023-07-14 ENCOUNTER — Ambulatory Visit: Payer: Self-pay | Attending: Cardiology | Admitting: Cardiology

## 2023-07-14 ENCOUNTER — Telehealth: Payer: Self-pay | Admitting: Licensed Clinical Social Worker

## 2023-07-14 VITALS — BP 122/81 | HR 92 | Ht 67.0 in | Wt 199.0 lb

## 2023-07-14 DIAGNOSIS — E782 Mixed hyperlipidemia: Secondary | ICD-10-CM

## 2023-07-14 DIAGNOSIS — Z9889 Other specified postprocedural states: Secondary | ICD-10-CM

## 2023-07-14 DIAGNOSIS — I2510 Atherosclerotic heart disease of native coronary artery without angina pectoris: Secondary | ICD-10-CM

## 2023-07-14 DIAGNOSIS — I25118 Atherosclerotic heart disease of native coronary artery with other forms of angina pectoris: Secondary | ICD-10-CM

## 2023-07-14 MED ORDER — ASPIRIN 81 MG PO TBEC: 81.0000 mg | DELAYED_RELEASE_TABLET | Freq: Every day | ORAL | 3 refills | Status: AC

## 2023-07-14 NOTE — Telephone Encounter (Signed)
 H&V Care Navigation CSW Progress Note  Clinical Social Worker completed chart review to provide assistance as pt is noted to be self pay. Per notes pt has [facility] Share plan, turns in bills for coverage eligibility determination. For billing purposes this is considered self pay and pt would be eligible for assistance with bills if not covered. At this time per staff pt declines assistance. Remain available should pt have any additional questions/concerns arise.  Patient is participating in a Managed Medicaid Plan:  No, self pay only [facility] Share plan)  SDOH Screenings   Food Insecurity: No Food Insecurity (05/17/2023)  Housing: Low Risk  (05/17/2023)  Transportation Needs: No Transportation Needs (05/17/2023)  Utilities: Not At Risk (05/17/2023)  Tobacco Use: Low Risk  (07/14/2023)    Marit Lark, MSW, LCSW Clinical Social Worker II [facility] Heart/Vascular Care Navigation  [phone]- work cell phone (preferred)

## 2023-07-14 NOTE — Progress Notes (Signed)
 Cardiology Office Note:  .   Date:  07/14/2023  ID:  Calvin Drake, DOB 1960/06/08, MRN [id number] PCP: [facility]  [facility] Providers Cardiologist:  Newman Lawrence, MD PCP: [facility]  Chief Complaint  Calvin Drake presents with   Mitral Regurgitation     Calvin Drake is a 63 y.o. male with nonobstructive CAD, mitral valve repair for severe primary mitral regurgitation (05/2023)  Calvin Drake underwent successful mitral repair with artificial cords in 05/2023 for severe primary mitral regurgitation.  Soon after surgery, Calvin Drake had an episode of near syncope that was attributed to dehydration and Lasix  dose was increased by PCP.  Since then, Calvin Drake has been feeling very well.  Calvin Drake wants to get back to regular physical activity and exercise, as Calvin Drake feels Calvin Drake has lost a lot of muscle mass.  Denies any chest pain, shortness of breath symptoms.  Reviewed recent echocardiogram results with the Calvin Drake, details below.    Vitals:   07/14/23 1501  BP: 122/81  Pulse: 92  SpO2: 97%        Review of Systems  Cardiovascular:  Positive for paroxysmal nocturnal dyspnea. Negative for chest pain, dyspnea on exertion, leg swelling, orthopnea, palpitations and syncope.        Studies Reviewed: SABRA        Labs 05/2023: Hb 12.2 Cr 1.24, K 5.2     Echocardiogram 06/2023:  1. LV function difficult to quantitate as endocardium not well  visualized.   2. Left ventricular ejection fraction, by estimation, is 45 to 50%. The  left ventricle has mildly decreased function. The left ventricle  demonstrates global hypokinesis. Left ventricular diastolic parameters are  indeterminate.   3. Right ventricular systolic function is normal. The right ventricular  size is normal.   4. The mitral valve has been repaired/replaced. Trivial mitral valve  regurgitation. No evidence of mitral stenosis. There is a 32 mm prosthetic  annuloplasty ring present in  the mitral position. Procedure Date: 05/23/23.   5. The aortic valve is tricuspid. Aortic valve regurgitation is not  visualized. No aortic stenosis is present.   Echocardiogram 04/22/2023: LVEF 60 to 65%.  Diastolic dysfunction likely erroneous given severe MR. Flail portion of posterior leaflet. Severe mitral valve regurgitation. Normal RV systolic function. Severe biatrial enlargement. Moderate pulmonary hypertension, RVSP 55 mmHg.      Physical Exam Vitals and nursing note reviewed.  Constitutional:      General: Calvin Drake is not in acute distress. Neck:     Vascular: JVD present.   Cardiovascular:     Rate and Rhythm: Normal rate and regular rhythm.     Heart sounds: No murmur heard. Pulmonary:     Effort: Pulmonary effort is normal.     Breath sounds: Normal breath sounds. No wheezing or rales.   Musculoskeletal:     Right lower leg: No edema.     Left lower leg: No edema.      VISIT DIAGNOSES:   ICD-10-CM   1. Mixed hyperlipidemia  E78.2 Lipid Profile    Lipid Profile    2. Coronary artery disease involving native coronary artery of native heart without angina pectoris  I25.10 Lipid Profile    ECHOCARDIOGRAM COMPLETE    Lipid Profile    3. H/O mitral valve repair  Z98.890 ECHOCARDIOGRAM COMPLETE    AMB referral to cardiac rehabilitation    4. Coronary artery disease of native artery of native heart with stable angina pectoris (HCC)  I25.118  [name] is a 63 y.o. 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 Currently, Calvin Drake is taking aspirin  325 mg daily.  I will reduce this to 81 mg daily for maintenance. EF is 45 to 50% within a few weeks postsurgery.  I am optimistic that this will continue to improve.  Ino  heart failure signs or symptoms this time. Farxiga and Lasix  were recently stopped  due to his near syncopal episode that was attributed to dehydration.  Therefore, hold off initiation of any new therapy.  Continue Coreg  3.125 mg twice daily. I will recheck echocardiogram in 3 months. Refer to cardiac rehab and [place], [place].  CAD: Moderate nonobstructive CAD noted on preoperative cath. Continue aspirin  81 mg daily. We discussed.  Calvin Drake wants to avoid at this time. Check lipid panel, discussed again at next office visit in 3 months.       Meds ordered this encounter  Medications   aspirin  EC 81 MG tablet    Sig: Take 1 tablet (81 mg total) by mouth daily. Swallow whole.    Dispense:  90 tablet    Refill:  3     F/u in 3 months  Signed, Newman JINNY Lawrence, MD

## 2023-07-14 NOTE — Patient Instructions (Signed)
 Medication Instructions:   STOP TAKING ASPIRIN  325 MG NOW  STOP TAKING TAKING JARDIANCE  NOW  STOP TAKING ROSUVASTATIN  (CRESTOR ) NOW  START TAKING ASPIRIN  81 MG BY MOUTH DAILY  *If you need a refill on your cardiac medications before your next appointment, please call your pharmacy*   You have been referred EXTERNALLY TO CARDIAC REHAB AT [facility] IN [place]--THERE CONTACT INFORMATION IS [phone] HAVE PLACED THE REFERRAL TO THEM AND THEY WILL BE THE ONES TO CALL YOU TO ARRANGE YOUR ORIENTATION APPOINTMENT/CLASS   Lab Work:  PLEASE HAVE YOUR LIPIDS CHECKED AT YOUR [facility] IN [place]--THE LIPID ORDER IS THERE AND RELEASED FOR THEM TO DRAW--PLEASE GO FASTING TO THIS LAB APPOINTMENT   If you have labs (blood work) drawn today and your tests are completely normal, you will receive your results only by: MyChart Message (if you have MyChart) OR A paper copy in the mail If you have any lab test that is abnormal or we need to change your treatment, we will call you to review the results.   Testing/Procedures:  Your physician has requested that you have an echocardiogram. Echocardiography is a painless test that uses sound waves to create images of your heart. It provides your doctor with information about the size and shape of your heart and how well your heart's chambers and valves are working. This procedure takes approximately one hour. There are no restrictions for this procedure.  PLEASE SCHEDULE ECHO TO BE DONE IN OCTOBER 2025 AND SAME DAY APPOINTMENT WITH DR. PATWARDHAN   Please do NOT wear cologne, perfume, aftershave, or lotions (deodorant is allowed). Please arrive 15 minutes prior to your appointment time.  Please note: We ask at that you not bring children with you during ultrasound (echo/ vascular) testing. Due to room size and safety concerns, children are not allowed in the ultrasound rooms during exams. Our front office staff cannot provide observation of  children in our lobby area while testing is being conducted. An adult accompanying a patient to their appointment will only be allowed in the ultrasound room at the discretion of the ultrasound technician under special circumstances. We apologize for any inconvenience.   Follow-Up:  IN OCTOBER 2025 WITH DR. PATWARDHAN--PLEASE SCHEDULE THE PATIENTS ECHO SAME DAY AS THIS APPOINTMENT PER DR. PATWARDHAN    OTHER INSTRUCTIONS:   Diet & Lifestyle recommendations:  Physical activity recommendation (The Physical Activity Guidelines for Americans. JAMA 2018;Nov 12) At least 150-300 minutes a week of moderate-intensity, or 75-150 minutes a week of vigorous-intensity aerobic physical activity, or an equivalent combination of moderate- and vigorous-intensity aerobic activity. Adults should perform muscle-strengthening activities on 2 or more days a week. Older adults should do multicomponent physical activity that includes balance training as well as aerobic and muscle-strengthening activities. Benefits of increased physical activity include lower risk of mortality including cardiovascular mortality, lower risk of cardiovascular events and associated risk factors (hypertension and diabetes), and lower risk of many cancers (including bladder, breast, colon, endometrium, esophagus, kidney, lung, and stomach). Additional improvments have been seen in cognition, risk of dementia, anxiety and depression, improved bone health, lower risk of falls, and associated injuries.  Dietary recommendation The 2019 ACC/AHA guidelines promote nutrition as a main fixture of cardiovascular wellness, with a recommendation for a varied diet of fruit, vegetables, fish, legumes, and whole grains (Class I), as well as recommendations to reduce sodium, cholesterol, processed meats, and refined sugars (Class IIa recommendation).10 Sodium intake, a topic of some controversy as of late, is recommended  to be kept at 1,500 mg/day or less, far  below the average daily intake in the [place]  of 3,409 mg/day, and notably below that of previous [place]  recommendations for 300mg /day.10,11 For those unable to reach 1,500 mg/day, they recommend at least a reduction of 1000 mg/day.  A Pesco-Mediterranean Diet With Intermittent Fasting: JACC Review Topic of the Week. J Am Coll Cardiol 2020;76:1484-1493 Pesco-Mediterranean diet, it is supplemented with extra-virgin olive oil (EVOO), which is the principle fat source, along with moderate amounts of dairy (particularly yogurt and cheese) and eggs, as well as modest amounts of alcohol consumption (ideally red wine with the evening meal), but few red and processed meats.

## 2023-07-15 ENCOUNTER — Encounter: Payer: Self-pay | Admitting: Cardiology

## 2023-07-15 ENCOUNTER — Telehealth: Payer: Self-pay | Admitting: *Deleted

## 2023-07-15 DIAGNOSIS — E782 Mixed hyperlipidemia: Secondary | ICD-10-CM | POA: Insufficient documentation

## 2023-07-15 DIAGNOSIS — I25118 Atherosclerotic heart disease of native coronary artery with other forms of angina pectoris: Secondary | ICD-10-CM | POA: Insufficient documentation

## 2023-07-15 NOTE — Telephone Encounter (Signed)
-----   Message from [place] sent at 07/15/2023 12:33 PM EDT ----- Regarding: RE: APPOINTMENTS NEEDING TO BE SCHEDULED FROM TODAY'S VISIT WITH DR. PATWARDHAN I called patient and had to leave VM for patient to call back to schedule fu and echo. The referral is in and will be sent out as soon as we get to him in the [facility].   Thank you all. ----- Message ----- From: Gladis Porter HERO, LPN Sent: [id number]/73/7974   5:20 PM EDT To: Levon ONEIDA Reymundo Olam KATHEE Janet; Mollie W Stu# Subject: APPOINTMENTS NEEDING TO BE SCHEDULED FROM TO#  This pt was seen by Dr. Elmira today and he could not stay to receive his AVS and go to checkout to make his appointments, for he had to commute to [place] to catch a plane  Dr. Elmira ordered several things on him, and I will place the AVS in [facility] mailbox to have to refer to when scheduling  Below is a list of appointments that are needing to be scheduled:   1.)  External referral placed to Cardiac Rehab in [place]--[facility]--their phone number is [phone]--Referral to them is for s/p MV repair  2.)  Dr. Elmira ordered for pt to have an ECHO and same day appointment with him in Oct 2025--echo is for (s/p MVR)--echo order is in the system and pt aware you will call him to arrange both the echo and follow-up appt same day, sometime in Oct.    Can you please help with the referral and getting the echo/OV appt in Oct scheduled, then shoot Lyle Rigg RN the dates for her follow-up?  Pt is aware you will be calling him to arrange these appts   Thanks for all you do, Karizma Cheek

## 2023-08-09 ENCOUNTER — Ambulatory Visit: Payer: Self-pay | Admitting: Cardiology

## 2023-08-16 ENCOUNTER — Ambulatory Visit: Payer: Self-pay | Admitting: General Practice

## 2023-10-21 ENCOUNTER — Encounter: Payer: Self-pay | Admitting: Cardiology

## 2023-10-21 ENCOUNTER — Ambulatory Visit: Payer: Self-pay | Admitting: Cardiology

## 2023-10-21 ENCOUNTER — Ambulatory Visit (HOSPITAL_COMMUNITY)
Admission: RE | Admit: 2023-10-21 | Discharge: 2023-10-21 | Disposition: A | Discharge status: Home / Self Care | Payer: MEDICAID | Source: Ambulatory Visit | Attending: Cardiology | Admitting: Cardiology

## 2023-10-21 VITALS — BP 119/80 | HR 68 | Resp 17 | Ht 67.0 in | Wt 205.0 lb

## 2023-10-21 DIAGNOSIS — I25118 Atherosclerotic heart disease of native coronary artery with other forms of angina pectoris: Secondary | ICD-10-CM

## 2023-10-21 DIAGNOSIS — I2510 Atherosclerotic heart disease of native coronary artery without angina pectoris: Secondary | ICD-10-CM | POA: Insufficient documentation

## 2023-10-21 DIAGNOSIS — Z9889 Other specified postprocedural states: Secondary | ICD-10-CM | POA: Insufficient documentation

## 2023-10-21 LAB — ECHOCARDIOGRAM COMPLETE
Area-P 1/2: 2.62 cm2
MV VTI: 1.44 cm2
S' Lateral: 3.5 cm

## 2023-10-21 NOTE — Patient Instructions (Signed)
 Lab Work: Lipid panel  If you have labs (blood work) drawn today and your tests are completely normal, you will receive your results only by: MyChart Message (if you have MyChart) OR A paper copy in the mail If you have any lab test that is abnormal or we need to change your treatment, we will call you to review the results.   Follow-Up: At [facility], you and your health needs are our priority.  As part of our continuing mission to provide you with exceptional heart care, our providers are all part of one team.  This team includes your primary Cardiologist (physician) and Advanced Practice Providers or APPs (Physician Assistants and Nurse Practitioners) who all work together to provide you with the care you need, when you need it.  Your next appointment:   6 month(s)  Provider:   Cody Das, MD

## 2023-10-21 NOTE — Progress Notes (Signed)
 Cardiology Office Note:  .   Date:  10/21/2023  ID:  Calvin Drake, DOB 1960/10/13, MRN [id number] PCP: [facility]  [facility] Providers Cardiologist:  Newman Lawrence, MD PCP: [facility]  Chief Complaint  Patient presents with   Mixed hyperlipidemia   Severe mitral regurgitation   Follow-up     Calvin Drake is a 63 y.o. 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  Soon after surgery, patient had an episode of near syncope that was attributed to dehydration and Lasix  dose was increased by PCP.  Since then, he has been feeling very well.  More recently, he has felt better than ever before in the recent past.  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  Denies any complains of chest pain or shortness of breath.  Has made significant changes to his diet to adhere to heart healthy diet.  However, a eats red meat 3 times a week, he does believe that red meat fat is good for heart health.    Reviewed echocardiogram from today, details below.  Vitals:   10/21/23 1042  BP: 119/80  Pulse: 68  Resp: 17  SpO2: 98%         Review of Systems  Cardiovascular:  Positive for paroxysmal nocturnal dyspnea. Negative for chest pain, dyspnea on exertion, leg swelling, orthopnea, palpitations and syncope.        Studies Reviewed: SABRA        Labs 05/2023: Hb 12.2 Cr 1.24, K 5.2    Echocardiogram 10/21/2023: Formal read pending, personally reviewed by me. EF appears improved at around 55%, compared to 45 to 50% on previous echocardiogram in 06/2023.  Echocardiogram 06/2023:  1. LV function difficult to quantitate as endocardium not well  visualized.   2. Left ventricular ejection fraction, by estimation, is 45 to 50%. The  left ventricle has  mildly decreased function. The left ventricle  demonstrates global hypokinesis. Left ventricular diastolic parameters are  indeterminate.   3. Right ventricular systolic function is normal. The right ventricular  size is normal.   4. The mitral valve has been repaired/replaced. Trivial mitral valve  regurgitation. No evidence of mitral stenosis. There is a 32 mm prosthetic  annuloplasty ring present in the mitral position. Procedure Date: 05/23/23.   5. The aortic valve is tricuspid. Aortic valve regurgitation is not  visualized. No aortic stenosis is present.   Echocardiogram 04/22/2023: LVEF 60 to 65%.  Diastolic dysfunction likely erroneous given severe MR. Flail portion of posterior leaflet. Severe mitral valve regurgitation. Normal RV systolic function. Severe biatrial enlargement. Moderate pulmonary hypertension, RVSP 55 mmHg.      Physical Exam Vitals and nursing note reviewed.  Constitutional:      General: He is not in acute distress. Neck:     Vascular: JVD present.  Cardiovascular:     Rate and Rhythm: Normal rate and regular rhythm.     Heart sounds: No murmur heard. Pulmonary:     Effort: Pulmonary effort is normal.     Breath sounds: Normal breath sounds. No wheezing or rales.  Musculoskeletal:     Right lower leg: No edema.     Left lower leg: No edema.      VISIT DIAGNOSES:   ICD-10-CM   1. Coronary artery disease of native artery of native heart with  stable angina pectoris  I25.118 Lipid panel    2. H/O mitral valve repair  Z98.890           Calvin Drake is a 63 y.o. 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 Continue aspirin  81 mg daily. Clinically, euvolemic and asymptomatic. By my personal read, EF appears to have improved on today's echocardiogram. I do not think he needs GDMT  for low EF.  CAD: Moderate nonobstructive CAD noted on preoperative cath. Continue aspirin  81 mg daily. He is reluctant to consider statin therapy at this time.  We discussed heart healthy diet and lifestyle.  He is doing fairly well from that standpoint, but we particular discussed about reducing red meat intake. Check lipid panel today.        F/u in 6 months  Signed, Newman JINNY Lawrence, MD

## 2023-10-22 LAB — LIPID PANEL
Chol/HDL Ratio: 4.8 ratio (ref 0.0–5.0)
Cholesterol, Total: 191 mg/dL (ref 100–199)
HDL: 40 mg/dL (ref >39)
LDL Chol Calc (NIH): 128 mg/dL — ABNORMAL HIGH (ref 0–99)
Triglycerides: 125 mg/dL (ref 0–149)
VLDL Cholesterol Cal: 23 mg/dL (ref 5–40)

## 2023-10-24 ENCOUNTER — Ambulatory Visit: Payer: Self-pay | Admitting: Cardiology

## 2023-10-24 DIAGNOSIS — E782 Mixed hyperlipidemia: Secondary | ICD-10-CM

## 2023-10-24 NOTE — Progress Notes (Signed)
 Cholesterol remains elevated.  Recommend diet and lifestyle recommendations as discussed at office visit last week.  Patient is not keen on starting statin therapy at this time.  Repeat lipid panel in 3 months.  Thanks MJP
